# Patient Record
Sex: Female | Born: 1955 | Race: Black or African American | Hispanic: No | Marital: Married | Smoking: Never smoker
Health system: Southern US, Community
[De-identification: ages and names within clinical notes are randomized; demographics above are authoritative.]

## PROBLEM LIST (undated history)

## (undated) DIAGNOSIS — Z9889 Other specified postprocedural states: Secondary | ICD-10-CM

## (undated) DIAGNOSIS — I219 Acute myocardial infarction, unspecified: Secondary | ICD-10-CM

## (undated) DIAGNOSIS — I10 Essential (primary) hypertension: Secondary | ICD-10-CM

## (undated) DIAGNOSIS — J3089 Other allergic rhinitis: Secondary | ICD-10-CM

## (undated) DIAGNOSIS — E785 Hyperlipidemia, unspecified: Secondary | ICD-10-CM

## (undated) HISTORY — DX: Essential (primary) hypertension: I10

## (undated) HISTORY — DX: Hyperlipidemia, unspecified: E78.5

## (undated) HISTORY — DX: Acute myocardial infarction, unspecified: I21.9

## (undated) HISTORY — DX: Other specified postprocedural states: Z98.890

## (undated) HISTORY — DX: Other allergic rhinitis: J30.89

---

## 1994-05-03 HISTORY — PX: UTERINE FIBROID SURGERY: SHX826

## 2013-01-04 ENCOUNTER — Ambulatory Visit: Payer: Self-pay

## 2014-08-14 ENCOUNTER — Encounter: Payer: Self-pay | Admitting: Internal Medicine

## 2014-08-14 DIAGNOSIS — Z9889 Other specified postprocedural states: Secondary | ICD-10-CM

## 2014-08-14 DIAGNOSIS — J302 Other seasonal allergic rhinitis: Secondary | ICD-10-CM | POA: Insufficient documentation

## 2014-08-14 DIAGNOSIS — I10 Essential (primary) hypertension: Secondary | ICD-10-CM | POA: Insufficient documentation

## 2014-08-14 HISTORY — DX: Other specified postprocedural states: Z98.890

## 2014-10-16 ENCOUNTER — Encounter: Payer: Self-pay | Admitting: Internal Medicine

## 2016-06-03 DIAGNOSIS — I219 Acute myocardial infarction, unspecified: Secondary | ICD-10-CM

## 2016-06-03 HISTORY — DX: Acute myocardial infarction, unspecified: I21.9

## 2017-01-01 HISTORY — PX: CORONARY ANGIOPLASTY WITH STENT PLACEMENT: SHX49

## 2017-01-11 DIAGNOSIS — I5020 Unspecified systolic (congestive) heart failure: Secondary | ICD-10-CM | POA: Insufficient documentation

## 2017-01-11 DIAGNOSIS — I509 Heart failure, unspecified: Secondary | ICD-10-CM | POA: Insufficient documentation

## 2017-01-11 DIAGNOSIS — I5022 Chronic systolic (congestive) heart failure: Secondary | ICD-10-CM | POA: Insufficient documentation

## 2017-01-11 DIAGNOSIS — I252 Old myocardial infarction: Secondary | ICD-10-CM | POA: Insufficient documentation

## 2017-01-11 LAB — HEMOGLOBIN A1C: Hemoglobin A1C: 6

## 2018-02-10 DIAGNOSIS — M1711 Unilateral primary osteoarthritis, right knee: Secondary | ICD-10-CM | POA: Diagnosis not present

## 2018-05-01 ENCOUNTER — Other Ambulatory Visit: Payer: Self-pay | Admitting: Internal Medicine

## 2018-05-01 ENCOUNTER — Encounter: Payer: Self-pay | Admitting: Internal Medicine

## 2018-05-01 DIAGNOSIS — E782 Mixed hyperlipidemia: Secondary | ICD-10-CM | POA: Insufficient documentation

## 2018-05-01 DIAGNOSIS — I214 Non-ST elevation (NSTEMI) myocardial infarction: Secondary | ICD-10-CM

## 2018-05-01 DIAGNOSIS — E781 Pure hyperglyceridemia: Secondary | ICD-10-CM | POA: Insufficient documentation

## 2018-05-01 DIAGNOSIS — R7303 Prediabetes: Secondary | ICD-10-CM | POA: Insufficient documentation

## 2018-05-02 ENCOUNTER — Ambulatory Visit: Payer: BLUE CROSS/BLUE SHIELD | Admitting: Internal Medicine

## 2018-05-02 ENCOUNTER — Encounter: Payer: Self-pay | Admitting: Internal Medicine

## 2018-05-02 VITALS — BP 118/68 | HR 79 | Ht 62.0 in | Wt 186.0 lb

## 2018-05-02 DIAGNOSIS — E781 Pure hyperglyceridemia: Secondary | ICD-10-CM | POA: Diagnosis not present

## 2018-05-02 DIAGNOSIS — B070 Plantar wart: Secondary | ICD-10-CM

## 2018-05-02 DIAGNOSIS — R7303 Prediabetes: Secondary | ICD-10-CM | POA: Diagnosis not present

## 2018-05-02 DIAGNOSIS — Z23 Encounter for immunization: Secondary | ICD-10-CM | POA: Diagnosis not present

## 2018-05-02 DIAGNOSIS — I252 Old myocardial infarction: Secondary | ICD-10-CM | POA: Diagnosis not present

## 2018-05-02 DIAGNOSIS — I10 Essential (primary) hypertension: Secondary | ICD-10-CM | POA: Diagnosis not present

## 2018-05-02 MED ORDER — ATORVASTATIN CALCIUM 80 MG PO TABS: 80.0000 mg | ORAL_TABLET | Freq: Every day | ORAL | 0 refills | Status: DC

## 2018-05-02 MED ORDER — CLOPIDOGREL BISULFATE 75 MG PO TABS: 75.0000 mg | ORAL_TABLET | Freq: Every day | ORAL | 0 refills | Status: DC

## 2018-05-02 MED ORDER — AMLODIPINE BESYLATE 10 MG PO TABS: 10.0000 mg | ORAL_TABLET | Freq: Every day | ORAL | 0 refills | Status: DC

## 2018-05-02 MED ORDER — CARVEDILOL 25 MG PO TABS: 25.0000 mg | ORAL_TABLET | Freq: Two times a day (BID) | ORAL | 0 refills | Status: DC

## 2018-05-02 NOTE — Progress Notes (Signed)
Date:  05/02/2018   Name:  Mardelle Radzinski   DOB:  01-25-1956   MRN:  [id number]   Chief Complaint: Establish Care; Hyperlipidemia (Med refill); Hypertension (Med refill. ); and Foot Pain (Left foot. Spot popped up on side of left foot abotu a year ago. Wanted to get looked at .)  Diabetes  She presents for her follow-up diabetic visit. Diabetes type: pre-diabetes. Pertinent negatives for hypoglycemia include no dizziness or headaches. Pertinent negatives for diabetes include no chest pain and no fatigue. An ACE inhibitor/angiotensin II receptor blocker is not being taken.  Hypertension  This is a chronic problem. Pertinent negatives include no chest pain, headaches, palpitations or shortness of breath. Hypertensive end-organ damage includes CAD/MI. PTCA and Stent 2018.  Hyperlipidemia  This is a chronic problem. Pertinent negatives include no chest pain or shortness of breath. Current antihyperlipidemic treatment includes statins. The current treatment provides significant improvement of lipids.  CAD - s/p PTCA and stent in 2018.  She has not been seen in follow up for a number of months.  I encourage her to follow up with [facility] at least yearly. Wart - on side of left foot - she files it down and sometimes a 'core' is noted in the center.  It is not always painful but causes more pressure when it enlarges. She has not sought any specific treatment.   Review of Systems  Constitutional: Negative for chills, fatigue and fever.  Respiratory: Negative for cough, chest tightness, shortness of breath and wheezing.   Cardiovascular: Negative for chest pain, palpitations and leg swelling.  Gastrointestinal: Negative for abdominal pain.  Musculoskeletal: Negative for arthralgias and gait problem.  Skin:       Corn/callus/wart on left foot  Allergic/Immunologic: Negative for environmental allergies.  Neurological: Negative for dizziness and headaches.  Hematological: Negative  for adenopathy. Does not bruise/bleed easily.  Psychiatric/Behavioral: Negative for dysphoric mood and sleep disturbance.    Patient Active Problem List   Diagnosis Date Noted  . Hypertriglyceridemia 05/01/2018  . Pre-diabetes 05/01/2018  . Systolic heart failure (HCC) 01/11/2017  . History of non-ST elevation myocardial infarction (NSTEMI) 01/11/2017  . Essential (primary) hypertension 08/14/2014  . Allergic rhinitis, seasonal 08/14/2014    Allergies  Allergen Reactions  . Codeine Nausea And Vomiting    Past Surgical History:  Procedure Laterality Date  . CORONARY ANGIOPLASTY WITH STENT PLACEMENT  01/2017   DES to LAD  . UTERINE FIBROID SURGERY  1996    Social History   Tobacco Use  . Smoking status: Never Smoker  . Smokeless tobacco: Never Used  Substance Use Topics  . Alcohol use: Never    Alcohol/week: 0.0 standard drinks    Frequency: Never  . Drug use: Never     Medication list has been reviewed and updated.  Current Meds  Medication Sig  . amLODipine (NORVASC) 10 MG tablet Take 10 mg by mouth daily.  Marland Kitchen aspirin 81 MG chewable tablet Chew 81 mg by mouth daily.  Marland Kitchen atorvastatin (LIPITOR) 80 MG tablet Take 80 mg by mouth daily.  . carvedilol (COREG) 25 MG tablet Take 1 tablet by mouth 2 (two) times daily.  . Cetirizine HCl 10 MG CAPS Take 1 capsule by mouth daily.  . clopidogrel (PLAVIX) 75 MG tablet Take 75 mg by mouth daily.  . fluticasone (FLONASE) 50 MCG/ACT nasal spray Place 2 sprays into the nose daily.    PHQ 2/9 Scores 05/02/2018  PHQ - 2 Score 0  Physical Exam Vitals signs and nursing note reviewed.  Constitutional:      General: She is not in acute distress.    Appearance: She is well-developed.  HENT:     Head: Normocephalic and atraumatic.  Eyes:     Extraocular Movements: Extraocular movements intact.     Pupils: Pupils are equal, round, and reactive to light.  Neck:     Musculoskeletal: Normal range of motion and neck supple.    Cardiovascular:     Rate and Rhythm: Normal rate and regular rhythm.     Pulses: Normal pulses.  Pulmonary:     Effort: Pulmonary effort is normal. No respiratory distress.     Breath sounds: Normal breath sounds.  Musculoskeletal: Normal range of motion.  Lymphadenopathy:     Cervical: No cervical adenopathy.  Skin:    General: Skin is warm and dry.     Findings: No rash.       Neurological:     Mental Status: She is alert and oriented to person, place, and time.     Sensory: Sensation is intact.     Motor: Motor function is intact.     Coordination: Coordination is intact.     Gait: Gait normal.     Deep Tendon Reflexes:     Reflex Scores:      Patellar reflexes are 1+ on the right side and 1+ on the left side. Psychiatric:        Mood and Affect: Mood normal.        Behavior: Behavior normal.        Thought Content: Thought content normal.     BP 118/68 (BP Location: Right Arm, Patient Position: Sitting, Cuff Size: Large)   Pulse 79   Ht 5\' 2"  (1.575 m)   Wt 186 lb (84.4 kg)   SpO2 96%   BMI 34.02 kg/m   Assessment and Plan: 1. Essential (primary) hypertension Controlled, continue current therapy - amLODipine (NORVASC) 10 MG tablet; Take 1 tablet (10 mg total) by mouth daily.  Dispense: 90 tablet; Refill: 0 - carvedilol (COREG) 25 MG tablet; Take 1 tablet (25 mg total) by mouth 2 (two) times daily.  Dispense: 180 tablet; Refill: 0 - CBC with Differential/Platelet - TSH  2. Hypertriglyceridemia Continue statin therapy - atorvastatin (LIPITOR) 80 MG tablet; Take 1 tablet (80 mg total) by mouth daily.  Dispense: 90 tablet; Refill: 0 - Comprehensive metabolic panel - Lipid panel  3. Pre-diabetes Check labs - Hemoglobin A1c  4. History of non-ST elevation myocardial infarction (NSTEMI) Continue Plavix, see Cardiology annually for follow up - clopidogrel (PLAVIX) 75 MG tablet; Take 1 tablet (75 mg total) by mouth daily.  Dispense: 90 tablet; Refill: 0  5.  Need for immunization against influenza - Flu Vaccine QUAD 36+ mos IM  6. Plantar wart Local care, refer to Podiatry or Dermatology if more problematic   Partially dictated using Dragon software. Any errors are unintentional.  Bari EdwardLaura Ladislav Caselli, MD [facility]  05/02/2018

## 2018-05-03 LAB — COMPREHENSIVE METABOLIC PANEL
ALBUMIN: 4.5 g/dL (ref 3.6–4.8)
ALT: 22 IU/L (ref 0–32)
AST: 22 IU/L (ref 0–40)
Albumin/Globulin Ratio: 1.7 (ref 1.2–2.2)
Alkaline Phosphatase: 127 IU/L — ABNORMAL HIGH (ref 39–117)
BUN/Creatinine Ratio: 14 (ref 12–28)
BUN: 13 mg/dL (ref 8–27)
Bilirubin Total: 0.3 mg/dL (ref 0.0–1.2)
CO2: 25 mmol/L (ref 20–29)
Calcium: 9.4 mg/dL (ref 8.7–10.3)
Chloride: 102 mmol/L (ref 96–106)
Creatinine, Ser: 0.92 mg/dL (ref 0.57–1.00)
GFR calc Af Amer: 76 mL/min/{1.73_m2} (ref >59)
GFR calc non Af Amer: 66 mL/min/{1.73_m2} (ref >59)
GLUCOSE: 88 mg/dL (ref 65–99)
Globulin, Total: 2.7 g/dL (ref 1.5–4.5)
Potassium: 4.1 mmol/L (ref 3.5–5.2)
Sodium: 141 mmol/L (ref 134–144)
Total Protein: 7.2 g/dL (ref 6.0–8.5)

## 2018-05-03 LAB — HEMOGLOBIN A1C
Est. average glucose Bld gHb Est-mCnc: 128 mg/dL
Hgb A1c MFr Bld: 6.1 % — ABNORMAL HIGH (ref 4.8–5.6)

## 2018-05-03 LAB — CBC WITH DIFFERENTIAL/PLATELET
Basophils Absolute: 0.1 10*3/uL (ref 0.0–0.2)
Basos: 1 %
EOS (ABSOLUTE): 0.1 10*3/uL (ref 0.0–0.4)
Eos: 2 %
Hematocrit: 40.4 % (ref 34.0–46.6)
Hemoglobin: 13.6 g/dL (ref 11.1–15.9)
Immature Grans (Abs): 0 10*3/uL (ref 0.0–0.1)
Immature Granulocytes: 1 %
Lymphocytes Absolute: 2.1 10*3/uL (ref 0.7–3.1)
Lymphs: 32 %
MCH: 28.8 pg (ref 26.6–33.0)
MCHC: 33.7 g/dL (ref 31.5–35.7)
MCV: 86 fL (ref 79–97)
Monocytes Absolute: 0.5 10*3/uL (ref 0.1–0.9)
Monocytes: 8 %
Neutrophils Absolute: 3.7 10*3/uL (ref 1.4–7.0)
Neutrophils: 56 %
Platelets: 446 10*3/uL (ref 150–450)
RBC: 4.72 x10E6/uL (ref 3.77–5.28)
RDW: 14.4 % (ref 12.3–15.4)
WBC: 6.6 10*3/uL (ref 3.4–10.8)

## 2018-05-03 LAB — LIPID PANEL
CHOL/HDL RATIO: 5.1 ratio — AB (ref 0.0–4.4)
Cholesterol, Total: 203 mg/dL — ABNORMAL HIGH (ref 100–199)
HDL: 40 mg/dL (ref >39)
LDL Calculated: 112 mg/dL — ABNORMAL HIGH (ref 0–99)
Triglycerides: 255 mg/dL — ABNORMAL HIGH (ref 0–149)
VLDL Cholesterol Cal: 51 mg/dL — ABNORMAL HIGH (ref 5–40)

## 2018-05-03 LAB — TSH: TSH: 2.2 u[IU]/mL (ref 0.450–4.500)

## 2018-07-18 ENCOUNTER — Telehealth: Payer: Self-pay

## 2018-07-18 MED ORDER — CETIRIZINE HCL 10 MG PO CAPS: 1.0000 | ORAL_CAPSULE | Freq: Every day | ORAL | 3 refills | Status: DC

## 2018-07-18 NOTE — Telephone Encounter (Signed)
Called for Rx of Zyrtec since [facility] is sold out. [facility]

## 2018-08-15 ENCOUNTER — Encounter: Payer: Self-pay | Admitting: Internal Medicine

## 2018-08-15 ENCOUNTER — Other Ambulatory Visit: Payer: Self-pay

## 2018-08-15 ENCOUNTER — Ambulatory Visit (INDEPENDENT_AMBULATORY_CARE_PROVIDER_SITE_OTHER): Payer: BLUE CROSS/BLUE SHIELD | Admitting: Internal Medicine

## 2018-08-15 VITALS — Ht 62.0 in | Wt 186.0 lb

## 2018-08-15 DIAGNOSIS — B373 Candidiasis of vulva and vagina: Secondary | ICD-10-CM

## 2018-08-15 DIAGNOSIS — B3731 Acute candidiasis of vulva and vagina: Secondary | ICD-10-CM

## 2018-08-15 MED ORDER — FLUCONAZOLE 100 MG PO TABS: 100.0000 mg | ORAL_TABLET | ORAL | 0 refills | Status: AC

## 2018-08-15 NOTE — Progress Notes (Signed)
Date:  08/15/2018   Name:  Cassidy Dawson   DOB:  1956/01/14   MRN:  [id number]  I connected with this patient, Cassidy Dawson, by telephone at the patient's home.  I verified that I am speaking with the correct person using two identifiers. This visit was conducted via telephone due to the Covid-19 outbreak from my office at [facility] in [place], [place]. I discussed the limitations, risks, security and privacy concerns of performing an evaluation and management service by telephone. I also discussed with the patient that there may be a patient responsible charge related to this service. The patient expressed understanding and agreed to proceed.  Chief Complaint: Rash (Rash on side of vagina. Started almost a week ago. Spray underwear and vagina with body spray. Itches sometimes. Only hurts when scratching. )  Rash  This is a new problem. The current episode started in the past 7 days. The affected locations include the genitalia. The rash is characterized by burning and itchiness. She was exposed to a new detergent/soap. Pertinent negatives include no cough, diarrhea, fever, shortness of breath or vomiting. Past treatments include nothing.  She describes white areas on both inner labia.  They are tender when scratched because of the itching.  She has not seen any vaginal bleeding or discharge.  She has burning with urination when the urine contacts the areas involved.  She has no hx of herpes and no concerns about STIs. She has not tried any treatments.  Review of Systems  Constitutional: Negative for chills and fever.  Respiratory: Negative for cough and shortness of breath.   Cardiovascular: Negative for chest pain.  Gastrointestinal: Negative for diarrhea and vomiting.  Genitourinary: Positive for genital sores. Negative for difficulty urinating, dysuria, frequency, pelvic pain, vaginal bleeding, vaginal discharge and vaginal pain.  Skin: Positive for rash.    Patient  Active Problem List   Diagnosis Date Noted  . Plantar wart 05/02/2018  . Hypertriglyceridemia 05/01/2018  . Pre-diabetes 05/01/2018  . Systolic heart failure (HCC) 01/11/2017  . History of non-ST elevation myocardial infarction (NSTEMI) 01/11/2017  . Essential (primary) hypertension 08/14/2014  . Allergic rhinitis, seasonal 08/14/2014    Allergies  Allergen Reactions  . Codeine Nausea And Vomiting    Past Surgical History:  Procedure Laterality Date  . CORONARY ANGIOPLASTY WITH STENT PLACEMENT  01/2017   DES to LAD  . UTERINE FIBROID SURGERY  1996    Social History   Tobacco Use  . Smoking status: Never Smoker  . Smokeless tobacco: Never Used  Substance Use Topics  . Alcohol use: Never    Alcohol/week: 0.0 standard drinks    Frequency: Never  . Drug use: Never     Medication list has been reviewed and updated.  Current Meds  Medication Sig  . amLODipine (NORVASC) 10 MG tablet Take 1 tablet (10 mg total) by mouth daily.  Marland Kitchen aspirin 81 MG chewable tablet Chew 81 mg by mouth daily.  Marland Kitchen atorvastatin (LIPITOR) 80 MG tablet Take 1 tablet (80 mg total) by mouth daily.  . carvedilol (COREG) 25 MG tablet Take 1 tablet (25 mg total) by mouth 2 (two) times daily.  . Cetirizine HCl 10 MG CAPS Take 1 capsule (10 mg total) by mouth daily.  . clopidogrel (PLAVIX) 75 MG tablet Take 1 tablet (75 mg total) by mouth daily.  . fluticasone (FLONASE) 50 MCG/ACT nasal spray Place 2 sprays into the nose daily.    PHQ 2/9 Scores 08/15/2018 05/02/2018  PHQ - 2 Score 0 0    BP Readings from Last 3 Encounters:  05/02/18 118/68  08/02/14 140/80    Physical Exam Pulmonary:     Comments: Voice normal, no apparent dyspnea during the call. Neurological:     Mental Status: She is alert.  Psychiatric:        Attention and Perception: Attention normal.        Mood and Affect: Mood normal.        Speech: Speech normal.        Cognition and Memory: Cognition normal.     Wt Readings from  Last 3 Encounters:  08/15/18 186 lb (84.4 kg)  05/02/18 186 lb (84.4 kg)  08/02/14 165 lb (74.8 kg)    Ht 5\' 2"  (1.575 m)   Wt 186 lb (84.4 kg)   BMI 34.02 kg/m   Assessment and Plan: 1. Yeast vaginitis Also use otc diaper rash ointment to protect the sensitive areas If no improvement, will need to be seen - fluconazole (DIFLUCAN) 100 MG tablet; Take 1 tablet (100 mg total) by mouth every other day for 3 doses.  Dispense: 3 tablet; Refill: 0  I spent 11 minutes on this call.  Partially dictated using Animal nutritionist. Any errors are unintentional.  Bari Edward, MD [facility]  08/15/2018

## 2018-09-04 ENCOUNTER — Other Ambulatory Visit: Payer: Self-pay | Admitting: Internal Medicine

## 2018-09-04 DIAGNOSIS — I10 Essential (primary) hypertension: Secondary | ICD-10-CM

## 2018-09-26 ENCOUNTER — Other Ambulatory Visit: Payer: Self-pay

## 2018-09-26 ENCOUNTER — Telehealth: Payer: Self-pay

## 2018-09-26 DIAGNOSIS — I10 Essential (primary) hypertension: Secondary | ICD-10-CM

## 2018-09-26 NOTE — Telephone Encounter (Signed)
Patient called saying she needs RFs on all of her medications. Informed her that her medications ran out in March and asked her if she has been taking. She said she ran out a long time ago. Just wants RFs. Told her to start back up on her medications we need to see her for her appt on Monday. She said okay and said she iwll be here.

## 2018-09-28 ENCOUNTER — Encounter: Payer: BLUE CROSS/BLUE SHIELD | Admitting: Internal Medicine

## 2018-10-02 ENCOUNTER — Other Ambulatory Visit: Payer: Self-pay

## 2018-10-02 ENCOUNTER — Ambulatory Visit (INDEPENDENT_AMBULATORY_CARE_PROVIDER_SITE_OTHER): Payer: BLUE CROSS/BLUE SHIELD | Admitting: Internal Medicine

## 2018-10-02 ENCOUNTER — Other Ambulatory Visit (HOSPITAL_COMMUNITY)
Admission: RE | Admit: 2018-10-02 | Discharge: 2018-10-02 | Disposition: A | Discharge status: Home / Self Care | Payer: BC Managed Care – PPO | Source: Ambulatory Visit | Attending: Internal Medicine | Admitting: Internal Medicine

## 2018-10-02 ENCOUNTER — Encounter: Payer: Self-pay | Admitting: Internal Medicine

## 2018-10-02 VITALS — BP 136/80 | HR 75 | Ht 62.0 in | Wt 188.0 lb

## 2018-10-02 DIAGNOSIS — Z1211 Encounter for screening for malignant neoplasm of colon: Secondary | ICD-10-CM

## 2018-10-02 DIAGNOSIS — R7303 Prediabetes: Secondary | ICD-10-CM

## 2018-10-02 DIAGNOSIS — I10 Essential (primary) hypertension: Secondary | ICD-10-CM | POA: Diagnosis not present

## 2018-10-02 DIAGNOSIS — Z124 Encounter for screening for malignant neoplasm of cervix: Secondary | ICD-10-CM

## 2018-10-02 DIAGNOSIS — Z1231 Encounter for screening mammogram for malignant neoplasm of breast: Secondary | ICD-10-CM | POA: Diagnosis not present

## 2018-10-02 DIAGNOSIS — Z0000 Encounter for general adult medical examination without abnormal findings: Secondary | ICD-10-CM | POA: Diagnosis not present

## 2018-10-02 DIAGNOSIS — E781 Pure hyperglyceridemia: Secondary | ICD-10-CM

## 2018-10-02 DIAGNOSIS — Z1159 Encounter for screening for other viral diseases: Secondary | ICD-10-CM

## 2018-10-02 DIAGNOSIS — I5020 Unspecified systolic (congestive) heart failure: Secondary | ICD-10-CM | POA: Diagnosis not present

## 2018-10-02 DIAGNOSIS — I252 Old myocardial infarction: Secondary | ICD-10-CM

## 2018-10-02 LAB — POCT URINALYSIS DIPSTICK
Bilirubin, UA: NEGATIVE
Blood, UA: NEGATIVE
Glucose, UA: NEGATIVE
Ketones, UA: NEGATIVE
Leukocytes, UA: NEGATIVE
Nitrite, UA: NEGATIVE
Protein, UA: NEGATIVE
Spec Grav, UA: 1.02 (ref 1.010–1.025)
Urobilinogen, UA: 0.2 E.U./dL
pH, UA: 5 (ref 5.0–8.0)

## 2018-10-02 MED ORDER — CLOPIDOGREL BISULFATE 75 MG PO TABS: 75.0000 mg | ORAL_TABLET | Freq: Every day | ORAL | 3 refills | Status: DC

## 2018-10-02 MED ORDER — AMLODIPINE BESYLATE 10 MG PO TABS: 10.0000 mg | ORAL_TABLET | Freq: Every day | ORAL | 3 refills | Status: DC

## 2018-10-02 MED ORDER — ATORVASTATIN CALCIUM 80 MG PO TABS: 80.0000 mg | ORAL_TABLET | Freq: Every day | ORAL | 3 refills | Status: DC

## 2018-10-02 NOTE — Progress Notes (Signed)
Date:  10/02/2018   Name:  Cassidy Dawson   DOB:  23-Nov-1955   MRN:  [id number]   Chief Complaint: Annual Exam (Breast exam and papsmear.) Cassidy Dawson is a 63 y.o. female who presents today for her Complete Annual Exam. She feels well. She reports exercising walking. She reports she is sleeping well.  No mammogram on file No colonoscopy on file No Pap on file  Hypertension  This is a chronic problem. The problem is controlled. Pertinent negatives include no chest pain, headaches, palpitations or shortness of breath. Past treatments include beta blockers and calcium channel blockers. The current treatment provides significant improvement. Hypertensive end-organ damage includes CAD/MI.  Hyperlipidemia  The problem is controlled. Pertinent negatives include no chest pain or shortness of breath. Current antihyperlipidemic treatment includes statins. The current treatment provides significant improvement of lipids. There are no compliance problems.   Diabetes  She presents for her follow-up diabetic visit. Diabetes type: pre-diabaetes. Pertinent negatives for hypoglycemia include no dizziness, headaches, nervousness/anxiousness or tremors. Pertinent negatives for diabetes include no chest pain, no fatigue, no polydipsia and no polyuria.  Uterine fibroids - she has her last Pap about 20 years ago and was normal.  She underwent fibroidectomy in hopes of being able to conceive.  Lab Results  Component Value Date   HGBA1C 6.1 (H) 05/02/2018   Lab Results  Component Value Date   CREATININE 0.92 05/02/2018   BUN 13 05/02/2018   NA 141 05/02/2018   K 4.1 05/02/2018   CL 102 05/02/2018   CO2 25 05/02/2018   Lab Results  Component Value Date   CHOL 203 (H) 05/02/2018   HDL 40 05/02/2018   LDLCALC 112 (H) 05/02/2018   TRIG 255 (H) 05/02/2018   CHOLHDL 5.1 (H) 05/02/2018     Review of Systems  Constitutional: Negative for chills, fatigue and fever.  HENT: Negative  for congestion, hearing loss, tinnitus, trouble swallowing and voice change.   Eyes: Negative for visual disturbance.  Respiratory: Negative for cough, chest tightness, shortness of breath and wheezing.   Cardiovascular: Negative for chest pain, palpitations and leg swelling.  Gastrointestinal: Negative for abdominal pain, constipation, diarrhea and vomiting.  Endocrine: Negative for polydipsia and polyuria.  Genitourinary: Negative for dysuria, frequency, genital sores, pelvic pain, vaginal bleeding and vaginal discharge.  Musculoskeletal: Negative for arthralgias, gait problem and joint swelling.  Skin: Negative for color change and rash.  Allergic/Immunologic: Positive for environmental allergies.  Neurological: Negative for dizziness, tremors, light-headedness and headaches.  Hematological: Negative for adenopathy. Does not bruise/bleed easily.  Psychiatric/Behavioral: Negative for dysphoric mood and sleep disturbance. The patient is not nervous/anxious.     Patient Active Problem List   Diagnosis Date Noted  . Plantar wart 05/02/2018  . Hypertriglyceridemia 05/01/2018  . Pre-diabetes 05/01/2018  . Systolic heart failure (HCC) 01/11/2017  . History of non-ST elevation myocardial infarction (NSTEMI) 01/11/2017  . Essential (primary) hypertension 08/14/2014  . Allergic rhinitis, seasonal 08/14/2014    Allergies  Allergen Reactions  . Codeine Nausea And Vomiting    Past Surgical History:  Procedure Laterality Date  . CORONARY ANGIOPLASTY WITH STENT PLACEMENT  01/2017   DES to LAD  . UTERINE FIBROID SURGERY  1996    Social History   Tobacco Use  . Smoking status: Never Smoker  . Smokeless tobacco: Never Used  Substance Use Topics  . Alcohol use: Never    Alcohol/week: 0.0 standard drinks    Frequency: Never  . Drug use:  Never     Medication list has been reviewed and updated.  Current Meds  Medication Sig  . amLODipine (NORVASC) 10 MG tablet Take 1 tablet (10 mg  total) by mouth daily.  Marland Kitchen aspirin 81 MG chewable tablet Chew 81 mg by mouth daily.  Marland Kitchen atorvastatin (LIPITOR) 80 MG tablet Take 1 tablet (80 mg total) by mouth daily.  . carvedilol (COREG) 25 MG tablet TAKE 1 TABLET(25 MG) BY MOUTH TWICE DAILY  . Cetirizine HCl 10 MG CAPS Take 1 capsule (10 mg total) by mouth daily.  . clopidogrel (PLAVIX) 75 MG tablet Take 1 tablet (75 mg total) by mouth daily.  . fluticasone (FLONASE) 50 MCG/ACT nasal spray Place 2 sprays into the nose daily.    PHQ 2/9 Scores 10/02/2018 08/15/2018 05/02/2018  PHQ - 2 Score 0 0 0    BP Readings from Last 3 Encounters:  10/02/18 136/80  05/02/18 118/68  08/02/14 140/80    Physical Exam Vitals signs and nursing note reviewed.  Constitutional:      General: She is not in acute distress.    Appearance: She is well-developed.  HENT:     Head: Normocephalic and atraumatic.     Right Ear: Tympanic membrane and ear canal normal.     Left Ear: Tympanic membrane and ear canal normal.     Nose:     Right Sinus: No maxillary sinus tenderness.     Left Sinus: No maxillary sinus tenderness.     Mouth/Throat:     Pharynx: Uvula midline.  Eyes:     General: No scleral icterus.       Right eye: No discharge.        Left eye: No discharge.     Conjunctiva/sclera: Conjunctivae normal.  Neck:     Musculoskeletal: Normal range of motion. No erythema.     Thyroid: No thyromegaly.     Vascular: No carotid bruit.  Cardiovascular:     Rate and Rhythm: Normal rate and regular rhythm.     Pulses: Normal pulses.     Heart sounds: Normal heart sounds.  Pulmonary:     Effort: Pulmonary effort is normal. No respiratory distress.     Breath sounds: No wheezing.  Chest:     Breasts:        Right: No mass, nipple discharge, skin change or tenderness.        Left: No mass, nipple discharge, skin change or tenderness.  Abdominal:     General: Bowel sounds are normal.     Palpations: Abdomen is soft.     Tenderness: There is no  abdominal tenderness.  Genitourinary:    Labia:        Right: No rash, tenderness or lesion.        Left: No rash, tenderness or lesion.      Vagina: Normal.     Cervix: Normal.     Uterus: Normal. Enlarged (right sided fullness ~4 cm c/w fibroid). Not tender.      Adnexa: Right adnexa normal and left adnexa normal.  Musculoskeletal: Normal range of motion.  Lymphadenopathy:     Cervical: No cervical adenopathy.  Skin:    General: Skin is warm and dry.     Findings: No rash.  Neurological:     Mental Status: She is alert and oriented to person, place, and time.     Cranial Nerves: No cranial nerve deficit.     Sensory: No sensory deficit.     Deep  Tendon Reflexes: Reflexes are normal and symmetric.  Psychiatric:        Speech: Speech normal.        Behavior: Behavior normal.        Thought Content: Thought content normal.     Wt Readings from Last 3 Encounters:  10/02/18 188 lb (85.3 kg)  08/15/18 186 lb (84.4 kg)  05/02/18 186 lb (84.4 kg)    BP 136/80   Pulse 75   Ht 5\' 2"  (1.575 m)   Wt 188 lb (85.3 kg)   SpO2 96%   BMI 34.39 kg/m   Assessment and Plan: 1. Annual physical exam Normal exam except for weight - discussed diet and exercise - POCT urinalysis dipstick  2. Encounter for screening mammogram for breast cancer Schedule at [facility] - MM 3D SCREEN BREAST BILATERAL; Future  3. Essential (primary) hypertension controlled - CBC with Differential/Platelet - Comprehensive metabolic panel - TSH - amLODipine (NORVASC) 10 MG tablet; Take 1 tablet (10 mg total) by mouth daily.  Dispense: 90 tablet; Refill: 3  4. Hypertriglyceridemia Continue medications - Lipid panel - atorvastatin (LIPITOR) 80 MG tablet; Take 1 tablet (80 mg total) by mouth daily.  Dispense: 90 tablet; Refill: 3  5. Pre-diabetes Check labs; discussed low carb diet - Hemoglobin A1c  6. Systolic heart failure, unspecified HF chronicity (HCC) Appears compensated - recommend Cardiology  follow up in the near future  7. Colon cancer screening - Fecal occult blood, imunochemical  8. Need for hepatitis C screening test - Hepatitis C antibody  9. History of non-ST elevation myocardial infarction (NSTEMI) Doing well - clopidogrel (PLAVIX) 75 MG tablet; Take 1 tablet (75 mg total) by mouth daily.  Dispense: 90 tablet; Refill: 3  10. Encounter for Papanicolaou smear for cervical cancer screening - Cytology - PAP   Partially dictated using Dragon software. Any errors are unintentional.  Bari Edward, MD [facility]  10/02/2018

## 2018-10-02 NOTE — Patient Instructions (Signed)

## 2018-10-03 LAB — TSH: TSH: 2.76 u[IU]/mL (ref 0.450–4.500)

## 2018-10-03 LAB — CBC WITH DIFFERENTIAL/PLATELET
Basophils Absolute: 0.1 10*3/uL (ref 0.0–0.2)
Basos: 1 %
EOS (ABSOLUTE): 0.1 10*3/uL (ref 0.0–0.4)
Eos: 1 %
Hematocrit: 41 % (ref 34.0–46.6)
Hemoglobin: 13.8 g/dL (ref 11.1–15.9)
Immature Grans (Abs): 0 10*3/uL (ref 0.0–0.1)
Immature Granulocytes: 0 %
Lymphocytes Absolute: 2.3 10*3/uL (ref 0.7–3.1)
Lymphs: 29 %
MCH: 29.7 pg (ref 26.6–33.0)
MCHC: 33.7 g/dL (ref 31.5–35.7)
MCV: 88 fL (ref 79–97)
Monocytes Absolute: 0.6 10*3/uL (ref 0.1–0.9)
Monocytes: 8 %
Neutrophils Absolute: 4.9 10*3/uL (ref 1.4–7.0)
Neutrophils: 61 %
Platelets: 412 10*3/uL (ref 150–450)
RBC: 4.64 x10E6/uL (ref 3.77–5.28)
RDW: 14 % (ref 11.7–15.4)
WBC: 8 10*3/uL (ref 3.4–10.8)

## 2018-10-03 LAB — COMPREHENSIVE METABOLIC PANEL
ALT: 17 IU/L (ref 0–32)
AST: 24 IU/L (ref 0–40)
Albumin/Globulin Ratio: 1.6 (ref 1.2–2.2)
Albumin: 4.6 g/dL (ref 3.8–4.8)
Alkaline Phosphatase: 118 IU/L — ABNORMAL HIGH (ref 39–117)
BUN/Creatinine Ratio: 22 (ref 12–28)
BUN: 18 mg/dL (ref 8–27)
Bilirubin Total: 0.5 mg/dL (ref 0.0–1.2)
CO2: 22 mmol/L (ref 20–29)
Calcium: 9.9 mg/dL (ref 8.7–10.3)
Chloride: 100 mmol/L (ref 96–106)
Creatinine, Ser: 0.83 mg/dL (ref 0.57–1.00)
GFR calc Af Amer: 86 mL/min/{1.73_m2} (ref >59)
GFR calc non Af Amer: 75 mL/min/{1.73_m2} (ref >59)
Globulin, Total: 2.9 g/dL (ref 1.5–4.5)
Glucose: 93 mg/dL (ref 65–99)
Potassium: 4.6 mmol/L (ref 3.5–5.2)
Sodium: 142 mmol/L (ref 134–144)
Total Protein: 7.5 g/dL (ref 6.0–8.5)

## 2018-10-03 LAB — LIPID PANEL
Chol/HDL Ratio: 5.3 ratio — ABNORMAL HIGH (ref 0.0–4.4)
Cholesterol, Total: 186 mg/dL (ref 100–199)
HDL: 35 mg/dL — ABNORMAL LOW (ref >39)
LDL Calculated: 113 mg/dL — ABNORMAL HIGH (ref 0–99)
Triglycerides: 189 mg/dL — ABNORMAL HIGH (ref 0–149)
VLDL Cholesterol Cal: 38 mg/dL (ref 5–40)

## 2018-10-03 LAB — HEPATITIS C ANTIBODY: Hep C Virus Ab: 0.1 s/co ratio (ref 0.0–0.9)

## 2018-10-03 LAB — HEMOGLOBIN A1C
Est. average glucose Bld gHb Est-mCnc: 126 mg/dL
Hgb A1c MFr Bld: 6 % — ABNORMAL HIGH (ref 4.8–5.6)

## 2018-10-05 LAB — CYTOLOGY - PAP: Diagnosis: NEGATIVE

## 2018-10-18 ENCOUNTER — Encounter (INDEPENDENT_AMBULATORY_CARE_PROVIDER_SITE_OTHER): Payer: Self-pay

## 2018-10-18 ENCOUNTER — Other Ambulatory Visit: Payer: Self-pay

## 2018-10-18 ENCOUNTER — Ambulatory Visit
Admission: RE | Admit: 2018-10-18 | Discharge: 2018-10-18 | Disposition: A | Discharge status: Home / Self Care | Payer: BC Managed Care – PPO | Source: Ambulatory Visit | Attending: Internal Medicine | Admitting: Internal Medicine

## 2018-10-18 DIAGNOSIS — Z1231 Encounter for screening mammogram for malignant neoplasm of breast: Secondary | ICD-10-CM | POA: Insufficient documentation

## 2018-10-19 ENCOUNTER — Other Ambulatory Visit: Payer: Self-pay | Admitting: Internal Medicine

## 2018-10-19 DIAGNOSIS — R928 Other abnormal and inconclusive findings on diagnostic imaging of breast: Secondary | ICD-10-CM

## 2018-10-19 DIAGNOSIS — N6310 Unspecified lump in the right breast, unspecified quadrant: Secondary | ICD-10-CM

## 2019-01-22 ENCOUNTER — Encounter: Payer: Self-pay | Admitting: Internal Medicine

## 2019-01-22 ENCOUNTER — Other Ambulatory Visit: Payer: Self-pay

## 2019-01-22 ENCOUNTER — Ambulatory Visit (INDEPENDENT_AMBULATORY_CARE_PROVIDER_SITE_OTHER): Payer: Medicare Other | Admitting: Internal Medicine

## 2019-01-22 VITALS — BP 120/78 | HR 60 | Ht 62.0 in | Wt 187.0 lb

## 2019-01-22 DIAGNOSIS — M533 Sacrococcygeal disorders, not elsewhere classified: Secondary | ICD-10-CM | POA: Diagnosis not present

## 2019-01-22 DIAGNOSIS — Z23 Encounter for immunization: Secondary | ICD-10-CM | POA: Diagnosis not present

## 2019-01-22 MED ORDER — CYCLOBENZAPRINE HCL 10 MG PO TABS: 10.0000 mg | ORAL_TABLET | Freq: Every day | ORAL | 0 refills | Status: DC

## 2019-01-22 NOTE — Progress Notes (Signed)
Date:  01/22/2019   Name:  Cassidy Dawson   DOB:  1956/04/23   MRN:  [id number]   Chief Complaint: Hip Pain (Left hip- no falls. Hurts within left buttcheek. Stabbing pain if moving in a certain way. Sitting for too long can cause her to have troble standing.) and Immunizations (reg dose flu shot.)  Hip Pain  The incident occurred 5 to 7 days ago. There was no injury mechanism. The pain is present in the left hip. The quality of the pain is described as burning and stabbing. The pain is mild. Pertinent negatives include no muscle weakness or numbness. She reports no foreign bodies present. The symptoms are aggravated by movement and palpation.    Review of Systems  Constitutional: Negative for chills, fatigue and fever.  Respiratory: Negative for chest tightness and shortness of breath.   Cardiovascular: Negative for chest pain.  Musculoskeletal: Positive for arthralgias. Negative for back pain, gait problem and joint swelling.  Neurological: Negative for weakness and numbness.    Patient Active Problem List   Diagnosis Date Noted  . Plantar wart 05/02/2018  . Hypertriglyceridemia 05/01/2018  . Pre-diabetes 05/01/2018  . Systolic heart failure ([place]) 01/11/2017  . History of non-ST elevation myocardial infarction (NSTEMI) 01/11/2017  . Essential (primary) hypertension 08/14/2014  . Allergic rhinitis, seasonal 08/14/2014    Allergies  Allergen Reactions  . Codeine Nausea And Vomiting    Past Surgical History:  Procedure Laterality Date  . CORONARY ANGIOPLASTY WITH STENT PLACEMENT  01/2017   DES to LAD  . UTERINE FIBROID SURGERY  1996    Social History   Tobacco Use  . Smoking status: Never Smoker  . Smokeless tobacco: Never Used  Substance Use Topics  . Alcohol use: Never    Alcohol/week: 0.0 standard drinks    Frequency: Never  . Drug use: Never     Medication list has been reviewed and updated.  Current Meds  Medication Sig  . amLODipine  (NORVASC) 10 MG tablet Take 1 tablet (10 mg total) by mouth daily.  Marland Kitchen aspirin 81 MG chewable tablet Chew 81 mg by mouth daily.  Marland Kitchen atorvastatin (LIPITOR) 80 MG tablet Take 1 tablet (80 mg total) by mouth daily.  . carvedilol (COREG) 25 MG tablet TAKE 1 TABLET(25 MG) BY MOUTH TWICE DAILY  . Cetirizine HCl 10 MG CAPS Take 1 capsule (10 mg total) by mouth daily.  . clopidogrel (PLAVIX) 75 MG tablet Take 1 tablet (75 mg total) by mouth daily.  . fluticasone (FLONASE) 50 MCG/ACT nasal spray Place 2 sprays into the nose daily.  . nitroGLYCERIN (NITROSTAT) 0.4 MG SL tablet Place 0.4 mg under the tongue every 5 (five) minutes as needed for chest pain.    PHQ 2/9 Scores 01/22/2019 10/02/2018 08/15/2018 05/02/2018  PHQ - 2 Score 0 0 0 0    BP Readings from Last 3 Encounters:  01/22/19 120/78  10/02/18 136/80  05/02/18 118/68    Physical Exam Vitals signs and nursing note reviewed.  Constitutional:      General: She is not in acute distress.    Appearance: She is well-developed.  HENT:     Head: Normocephalic and atraumatic.  Cardiovascular:     Rate and Rhythm: Normal rate and regular rhythm.  Pulmonary:     Effort: Pulmonary effort is normal. No respiratory distress.     Breath sounds: Normal breath sounds.  Abdominal:     General: Abdomen is flat. Bowel sounds are normal.  Tenderness: There is no abdominal tenderness.  Musculoskeletal: Normal range of motion.     Right hip: Normal.     Left hip: Normal.     Lumbar back: She exhibits bony tenderness (over right SI region).     Comments: SLR negative bilaterally  Skin:    General: Skin is warm and dry.     Findings: No rash.  Neurological:     Mental Status: She is alert and oriented to person, place, and time.  Psychiatric:        Behavior: Behavior normal.        Thought Content: Thought content normal.     Wt Readings from Last 3 Encounters:  01/22/19 187 lb (84.8 kg)  10/02/18 188 lb (85.3 kg)  08/15/18 186 lb (84.4 kg)     BP 120/78   Pulse 60   Ht 5\' 2"  (1.575 m)   Wt 187 lb (84.8 kg)   SpO2 97%   BMI 34.20 kg/m   Assessment and Plan: 1. Sacro-iliac pain Take tylenol three times per day Use ice or heat Flexeril at bedtime Follow up as needed - cyclobenzaprine (FLEXERIL) 10 MG tablet; Take 1 tablet (10 mg total) by mouth at bedtime.  Dispense: 30 tablet; Refill: 0  2. Need for immunization against influenza - Flu Vaccine QUAD 36+ mos IM   Partially dictated using Animal nutritionist. Any errors are unintentional.  Bari Edward, MD [facility]  01/22/2019

## 2019-01-22 NOTE — Patient Instructions (Addendum)
Take Tylenol three times a day.  Use Flexeril only at bedtime.  Use Ice or Heat as needed

## 2019-02-03 ENCOUNTER — Other Ambulatory Visit: Payer: Self-pay | Admitting: Internal Medicine

## 2019-02-03 DIAGNOSIS — I10 Essential (primary) hypertension: Secondary | ICD-10-CM

## 2019-04-04 ENCOUNTER — Ambulatory Visit: Payer: BLUE CROSS/BLUE SHIELD | Admitting: Internal Medicine

## 2019-04-10 ENCOUNTER — Ambulatory Visit: Payer: Medicare Other | Admitting: Internal Medicine

## 2019-07-02 ENCOUNTER — Ambulatory Visit: Payer: Medicare Other | Admitting: Internal Medicine

## 2019-08-29 LAB — HEMOGLOBIN A1C: Hemoglobin A1C: 6.3

## 2019-08-30 ENCOUNTER — Telehealth: Payer: Self-pay | Admitting: Internal Medicine

## 2019-08-30 ENCOUNTER — Other Ambulatory Visit: Payer: Self-pay

## 2019-08-30 NOTE — Telephone Encounter (Signed)
Reviewed with Dr Judithann Graves and abstracted pts A1C.  CM

## 2019-08-30 NOTE — Telephone Encounter (Signed)
Copied from CRM [id number]. Topic: General - Inquiry >> Aug 29, 2019  4:51 PM Floria Raveling A wrote: Reason for CRM:  NP with house call called in and wanted Dr Judithann Graves to know what pts number were  A1c 6.3 Weight was 187 Bmi 34.2

## 2019-10-04 ENCOUNTER — Other Ambulatory Visit: Payer: Self-pay | Admitting: Internal Medicine

## 2019-10-04 ENCOUNTER — Ambulatory Visit (INDEPENDENT_AMBULATORY_CARE_PROVIDER_SITE_OTHER): Payer: Medicare Other | Admitting: Internal Medicine

## 2019-10-04 ENCOUNTER — Encounter: Payer: Self-pay | Admitting: Internal Medicine

## 2019-10-04 ENCOUNTER — Other Ambulatory Visit: Payer: Self-pay

## 2019-10-04 VITALS — BP 142/80 | HR 62 | Temp 98.2°F | Ht 62.0 in | Wt 188.0 lb

## 2019-10-04 DIAGNOSIS — Z0000 Encounter for general adult medical examination without abnormal findings: Secondary | ICD-10-CM | POA: Diagnosis not present

## 2019-10-04 DIAGNOSIS — I252 Old myocardial infarction: Secondary | ICD-10-CM

## 2019-10-04 DIAGNOSIS — Z1231 Encounter for screening mammogram for malignant neoplasm of breast: Secondary | ICD-10-CM

## 2019-10-04 DIAGNOSIS — Z1211 Encounter for screening for malignant neoplasm of colon: Secondary | ICD-10-CM

## 2019-10-04 DIAGNOSIS — R7303 Prediabetes: Secondary | ICD-10-CM | POA: Diagnosis not present

## 2019-10-04 DIAGNOSIS — H9190 Unspecified hearing loss, unspecified ear: Secondary | ICD-10-CM

## 2019-10-04 DIAGNOSIS — Z23 Encounter for immunization: Secondary | ICD-10-CM

## 2019-10-04 DIAGNOSIS — I10 Essential (primary) hypertension: Secondary | ICD-10-CM

## 2019-10-04 DIAGNOSIS — Z114 Encounter for screening for human immunodeficiency virus [HIV]: Secondary | ICD-10-CM

## 2019-10-04 DIAGNOSIS — I5020 Unspecified systolic (congestive) heart failure: Secondary | ICD-10-CM

## 2019-10-04 DIAGNOSIS — Z13820 Encounter for screening for osteoporosis: Secondary | ICD-10-CM

## 2019-10-04 DIAGNOSIS — E781 Pure hyperglyceridemia: Secondary | ICD-10-CM

## 2019-10-04 LAB — POCT URINALYSIS DIPSTICK
Bilirubin, UA: NEGATIVE
Blood, UA: NEGATIVE
Glucose, UA: NEGATIVE
Ketones, UA: NEGATIVE
Leukocytes, UA: NEGATIVE
Nitrite, UA: NEGATIVE
Protein, UA: NEGATIVE
Spec Grav, UA: 1.015 (ref 1.010–1.025)
Urobilinogen, UA: 0.2 E.U./dL
pH, UA: 5 (ref 5.0–8.0)

## 2019-10-04 MED ORDER — CLOPIDOGREL BISULFATE 75 MG PO TABS: 75.0000 mg | ORAL_TABLET | Freq: Every day | ORAL | 3 refills | Status: DC

## 2019-10-04 MED ORDER — ATORVASTATIN CALCIUM 80 MG PO TABS: 80.0000 mg | ORAL_TABLET | Freq: Every day | ORAL | 3 refills | Status: DC

## 2019-10-04 MED ORDER — FLUTICASONE PROPIONATE 50 MCG/ACT NA SUSP
2.0000 | Freq: Every day | NASAL | 0 refills | Status: DC
Start: 2019-10-04 — End: 2019-10-04

## 2019-10-04 MED ORDER — AMLODIPINE BESYLATE 10 MG PO TABS: 10.0000 mg | ORAL_TABLET | Freq: Every day | ORAL | 3 refills | Status: DC

## 2019-10-04 NOTE — Progress Notes (Signed)
Date:  10/04/2019   Name:  Cassidy Dawson   DOB:  1956-03-07   MRN:  [id number]   Chief Complaint: Annual Exam (breast exam/ no pap/ HIV screening/pneu shot) Cassidy Dawson is a 64 y.o. female who presents today for her Complete Annual Exam. She feels well. She reports exercising walking occasionally. She reports she is sleeping well. She denies breast issues.  Mammogram  10/2018 DEXA none Colonoscopy - none FIT given last year but not done Immunization History  Administered Date(s) Administered  . Influenza,inj,Quad PF,6+ Mos 06/28/2016, 05/02/2018, 01/22/2019  . Janssen (J&J) SARS-COV-2 Vaccination 07/14/2019    Hypertension This is a chronic problem. The problem is controlled. Pertinent negatives include no chest pain, headaches, palpitations or shortness of breath (with exertion). Past treatments include beta blockers and calcium channel blockers. The current treatment provides significant improvement. Hypertensive end-organ damage includes CAD/MI (nstemi in 2018 managed medically). There is no history of kidney disease or CVA.  Hyperlipidemia The problem is controlled. Pertinent negatives include no chest pain or shortness of breath (with exertion). Current antihyperlipidemic treatment includes statins. The current treatment provides significant improvement of lipids.  Diabetes She presents for her follow-up diabetic visit. Diabetes type: prediabetes. Pertinent negatives for hypoglycemia include no dizziness, headaches, nervousness/anxiousness or tremors. Pertinent negatives for diabetes include no chest pain, no fatigue, no polydipsia and no polyuria. Pertinent negatives for diabetic complications include no CVA. Current diabetic treatment includes diet. She is compliant with treatment most of the time.  CAD s/p nstemi in 2018 - seen at [facility], treated medically after RHC.  Review of records shows no cardiology follow up has been done.  She has some DOE but it is  stable. Known 2-vessel disease after she presented to [facility] on in February 2018 with hypertensive emergency and NSTEMI. Her troponin peaked at 14.4, TTE showed LVH w/ LVEF of 35-40%, G2DD, and mildly dilated RV with reduced systolic function. Her LHC showed diffuse disease of the LCx and RCA. No PCI was performed at that admission and medical management was recommended w/ ASA, Plavix, Coreg, Lipitor.    Lab Results  Component Value Date   CREATININE 0.83 10/02/2018   BUN 18 10/02/2018   NA 142 10/02/2018   K 4.6 10/02/2018   CL 100 10/02/2018   CO2 22 10/02/2018   Lab Results  Component Value Date   CHOL 186 10/02/2018   HDL 35 (L) 10/02/2018   LDLCALC 113 (H) 10/02/2018   TRIG 189 (H) 10/02/2018   CHOLHDL 5.3 (H) 10/02/2018   Lab Results  Component Value Date   TSH 2.760 10/02/2018   Lab Results  Component Value Date   HGBA1C 6.3 08/29/2019   Lab Results  Component Value Date   WBC 8.0 10/02/2018   HGB 13.8 10/02/2018   HCT 41.0 10/02/2018   MCV 88 10/02/2018   PLT 412 10/02/2018   Lab Results  Component Value Date   ALT 17 10/02/2018   AST 24 10/02/2018   ALKPHOS 118 (H) 10/02/2018   BILITOT 0.5 10/02/2018     Review of Systems  Constitutional: Negative for chills, fatigue and fever.  HENT: Positive for hearing loss. Negative for congestion, tinnitus, trouble swallowing and voice change.   Eyes: Negative for visual disturbance.  Respiratory: Negative for cough, chest tightness, shortness of breath (with exertion) and wheezing.   Cardiovascular: Negative for chest pain, palpitations and leg swelling.  Gastrointestinal: Negative for abdominal pain, constipation, diarrhea and vomiting.  Endocrine: Negative for polydipsia  and polyuria.  Genitourinary: Negative for dysuria, frequency, genital sores, vaginal bleeding and vaginal discharge.  Musculoskeletal: Negative for arthralgias, gait problem and joint swelling.  Skin: Negative for color change and rash.   Allergic/Immunologic: Positive for environmental allergies.  Neurological: Negative for dizziness, tremors, light-headedness and headaches.  Hematological: Negative for adenopathy. Does not bruise/bleed easily.  Psychiatric/Behavioral: Negative for dysphoric mood and sleep disturbance. The patient is not nervous/anxious.     Patient Active Problem List   Diagnosis Date Noted  . Plantar wart 05/02/2018  . Hypertriglyceridemia 05/01/2018  . Pre-diabetes 05/01/2018  . Systolic heart failure ([facility]) 01/11/2017  . History of non-ST elevation myocardial infarction (NSTEMI) 01/11/2017  . Essential (primary) hypertension 08/14/2014  . Allergic rhinitis, seasonal 08/14/2014    Allergies  Allergen Reactions  . Codeine Nausea And Vomiting    Past Surgical History:  Procedure Laterality Date  . CORONARY ANGIOPLASTY WITH STENT PLACEMENT  01/2017   DES to LAD  . UTERINE FIBROID SURGERY  1996    Social History   Tobacco Use  . Smoking status: Never Smoker  . Smokeless tobacco: Never Used  Substance Use Topics  . Alcohol use: Never    Alcohol/week: 0.0 standard drinks  . Drug use: Never     Medication list has been reviewed and updated.  Current Meds  Medication Sig  . amLODipine (NORVASC) 10 MG tablet Take 1 tablet (10 mg total) by mouth daily.  Marland Kitchen aspirin 81 MG chewable tablet Chew 81 mg by mouth daily.  Marland Kitchen atorvastatin (LIPITOR) 80 MG tablet Take 1 tablet (80 mg total) by mouth daily.  . carvedilol (COREG) 25 MG tablet TAKE 1 TABLET(25 MG) BY MOUTH TWICE DAILY  . Cetirizine HCl 10 MG CAPS Take 1 capsule (10 mg total) by mouth daily. (Patient taking differently: Take 1 capsule by mouth daily. 1/2 tablet 5 mg)  . clopidogrel (PLAVIX) 75 MG tablet Take 1 tablet (75 mg total) by mouth daily.  . cyclobenzaprine (FLEXERIL) 10 MG tablet Take 1 tablet (10 mg total) by mouth at bedtime. (Patient taking differently: Take 10 mg by mouth at bedtime. As needed)  . fluticasone (FLONASE) 50  MCG/ACT nasal spray Place 2 sprays into the nose daily.  . nitroGLYCERIN (NITROSTAT) 0.4 MG SL tablet Place 0.4 mg under the tongue every 5 (five) minutes as needed for chest pain.  Marland Kitchen VITAMIN D, CHOLECALCIFEROL, PO Take 1 tablet by mouth.    PHQ 2/9 Scores 10/04/2019 01/22/2019 10/02/2018 08/15/2018  PHQ - 2 Score 0 0 0 0  PHQ- 9 Score 0 - - -   GAD 7 : Generalized Anxiety Score 10/04/2019  Nervous, Anxious, on Edge 0  Control/stop worrying 0  Worry too much - different things 0  Trouble relaxing 0  Restless 0  Easily annoyed or irritable 0  Afraid - awful might happen 0  Total GAD 7 Score 0  Anxiety Difficulty Not difficult at all    BP Readings from Last 3 Encounters:  10/04/19 (!) 142/80  01/22/19 120/78  10/02/18 136/80    Physical Exam Vitals and nursing note reviewed.  Constitutional:      General: She is not in acute distress.    Appearance: She is well-developed.  HENT:     Head: Normocephalic and atraumatic.     Right Ear: Tympanic membrane and ear canal normal.     Left Ear: Tympanic membrane and ear canal normal.     Nose:     Right Sinus: No maxillary sinus tenderness.  Left Sinus: No maxillary sinus tenderness.  Eyes:     General: No scleral icterus.       Right eye: No discharge.        Left eye: No discharge.     Conjunctiva/sclera: Conjunctivae normal.  Neck:     Thyroid: No thyromegaly.     Vascular: No carotid bruit.  Cardiovascular:     Rate and Rhythm: Normal rate and regular rhythm.     Pulses: Normal pulses.     Heart sounds: Normal heart sounds.  Pulmonary:     Effort: Pulmonary effort is normal. No respiratory distress.     Breath sounds: No wheezing.  Chest:     Breasts:        Right: No mass, nipple discharge, skin change or tenderness.        Left: No mass, nipple discharge, skin change or tenderness.  Abdominal:     General: Bowel sounds are normal.     Palpations: Abdomen is soft.     Tenderness: There is no abdominal tenderness.  There is no guarding or rebound.  Musculoskeletal:        General: Normal range of motion.     Cervical back: Normal range of motion. No erythema.     Right lower leg: No edema.     Left lower leg: No edema.  Lymphadenopathy:     Cervical: No cervical adenopathy.  Skin:    General: Skin is warm and dry.     Capillary Refill: Capillary refill takes less than 2 seconds.     Findings: No rash.  Neurological:     General: No focal deficit present.     Mental Status: She is alert and oriented to person, place, and time.     Cranial Nerves: No cranial nerve deficit.     Sensory: No sensory deficit.     Deep Tendon Reflexes: Reflexes are normal and symmetric.  Psychiatric:        Mood and Affect: Mood normal.        Speech: Speech normal.        Behavior: Behavior normal.     Wt Readings from Last 3 Encounters:  10/04/19 188 lb (85.3 kg)  01/22/19 187 lb (84.8 kg)  10/02/18 188 lb (85.3 kg)    BP (!) 142/80   Pulse 62   Temp 98.2 F (36.8 C) (Oral)   Ht 5\' 2"  (1.575 m)   Wt 188 lb (85.3 kg)   SpO2 96%   BMI 34.39 kg/m   Assessment and Plan: 1. Annual physical exam Normal exam except for weight Needs to work on diet and exercise as able - POCT urinalysis dipstick  2. Encounter for screening mammogram for breast cancer To be scheduled at [facility] - MM 3D SCREEN BREAST BILATERAL; Future  3. Colon cancer screening - Fecal occult blood, imunochemical  4. Essential (primary) hypertension Clinically stable exam with well controlled BP on current regimen. Tolerating medications without side effects at this time. Pt to continue current regimen and low sodium diet; benefits of regular exercise as able discussed. - CBC with Differential/Platelet - Comprehensive metabolic panel - TSH - amLODipine (NORVASC) 10 MG tablet; Take 1 tablet (10 mg total) by mouth daily.  Dispense: 90 tablet; Refill: 3  5. Hypertriglyceridemia Tolerating statin medication without side effects at this  time LDL is not at goal of < 70 on current dose.   Continue same therapy but may need to add medication if not improved -  Lipid panel - atorvastatin (LIPITOR) 80 MG tablet; Take 1 tablet (80 mg total) by mouth daily.  Dispense: 90 tablet; Refill: 3  6. Pre-diabetes Re-inforced low carb diet and weight control - Hemoglobin A1c  7. History of non-ST elevation myocardial infarction (NSTEMI) No cardiology follow up since her RHC in 2018 Having some stable DOE without chest pain. Continue Aspirin and Plavix and will refer back to [facility] - Ambulatory referral to Cardiology - clopidogrel (PLAVIX) 75 MG tablet; Take 1 tablet (75 mg total) by mouth daily.  Dispense: 90 tablet; Refill: 3  8. Need for vaccination for pneumococcus - Pneumococcal conjugate vaccine 13-valent IM  9. Encounter for screening for HIV - HIV Antibody (routine testing w rflx)  10. Systolic heart failure, unspecified HF chronicity [facility]) Per cardiology records On coreg  11. Encounter for screening for osteoporosis - DG Bone Density; Future  12. Decreased hearing, unspecified laterality - Ambulatory referral to ENT   Partially dictated using Dragon software. Any errors are unintentional.  Bari Edward, MD [facility]  10/04/2019

## 2019-10-04 NOTE — Addendum Note (Signed)
Addended by: Reubin Milan on: 10/04/2019 12:55 PM   Modules accepted: Orders

## 2019-10-05 LAB — COMPREHENSIVE METABOLIC PANEL
ALT: 16 IU/L (ref 0–32)
AST: 13 IU/L (ref 0–40)
Albumin/Globulin Ratio: 1.4 (ref 1.2–2.2)
Albumin: 4.2 g/dL (ref 3.8–4.8)
Alkaline Phosphatase: 141 IU/L — ABNORMAL HIGH (ref 48–121)
BUN/Creatinine Ratio: 13 (ref 12–28)
BUN: 10 mg/dL (ref 8–27)
Bilirubin Total: 0.4 mg/dL (ref 0.0–1.2)
CO2: 24 mmol/L (ref 20–29)
Calcium: 9.6 mg/dL (ref 8.7–10.3)
Chloride: 104 mmol/L (ref 96–106)
Creatinine, Ser: 0.8 mg/dL (ref 0.57–1.00)
GFR calc Af Amer: 89 mL/min/{1.73_m2} (ref >59)
GFR calc non Af Amer: 78 mL/min/{1.73_m2} (ref >59)
Globulin, Total: 3 g/dL (ref 1.5–4.5)
Glucose: 108 mg/dL — ABNORMAL HIGH (ref 65–99)
Potassium: 4.7 mmol/L (ref 3.5–5.2)
Sodium: 140 mmol/L (ref 134–144)
Total Protein: 7.2 g/dL (ref 6.0–8.5)

## 2019-10-05 LAB — CBC WITH DIFFERENTIAL/PLATELET
Basophils Absolute: 0.1 10*3/uL (ref 0.0–0.2)
Basos: 1 %
EOS (ABSOLUTE): 0.2 10*3/uL (ref 0.0–0.4)
Eos: 2 %
Hematocrit: 41.2 % (ref 34.0–46.6)
Hemoglobin: 13.2 g/dL (ref 11.1–15.9)
Immature Grans (Abs): 0 10*3/uL (ref 0.0–0.1)
Immature Granulocytes: 0 %
Lymphocytes Absolute: 1.9 10*3/uL (ref 0.7–3.1)
Lymphs: 28 %
MCH: 28.6 pg (ref 26.6–33.0)
MCHC: 32 g/dL (ref 31.5–35.7)
MCV: 89 fL (ref 79–97)
Monocytes Absolute: 0.4 10*3/uL (ref 0.1–0.9)
Monocytes: 7 %
Neutrophils Absolute: 4.2 10*3/uL (ref 1.4–7.0)
Neutrophils: 62 %
Platelets: 424 10*3/uL (ref 150–450)
RBC: 4.61 x10E6/uL (ref 3.77–5.28)
RDW: 13.4 % (ref 11.7–15.4)
WBC: 6.8 10*3/uL (ref 3.4–10.8)

## 2019-10-05 LAB — LIPID PANEL
Chol/HDL Ratio: 4.6 ratio — ABNORMAL HIGH (ref 0.0–4.4)
Cholesterol, Total: 148 mg/dL (ref 100–199)
HDL: 32 mg/dL — ABNORMAL LOW (ref >39)
LDL Chol Calc (NIH): 88 mg/dL (ref 0–99)
Triglycerides: 162 mg/dL — ABNORMAL HIGH (ref 0–149)
VLDL Cholesterol Cal: 28 mg/dL (ref 5–40)

## 2019-10-05 LAB — HIV ANTIBODY (ROUTINE TESTING W REFLEX): HIV Screen 4th Generation wRfx: NONREACTIVE

## 2019-10-05 LAB — HEMOGLOBIN A1C
Est. average glucose Bld gHb Est-mCnc: 126 mg/dL
Hgb A1c MFr Bld: 6 % — ABNORMAL HIGH (ref 4.8–5.6)

## 2019-10-05 LAB — TSH: TSH: 2.08 u[IU]/mL (ref 0.450–4.500)

## 2019-10-07 ENCOUNTER — Other Ambulatory Visit: Payer: Self-pay | Admitting: Internal Medicine

## 2019-10-07 DIAGNOSIS — E781 Pure hyperglyceridemia: Secondary | ICD-10-CM

## 2019-10-17 DIAGNOSIS — H903 Sensorineural hearing loss, bilateral: Secondary | ICD-10-CM | POA: Diagnosis not present

## 2019-10-22 ENCOUNTER — Other Ambulatory Visit: Payer: Self-pay

## 2019-11-15 ENCOUNTER — Other Ambulatory Visit: Payer: Self-pay

## 2019-12-11 ENCOUNTER — Other Ambulatory Visit: Payer: Self-pay

## 2019-12-31 ENCOUNTER — Other Ambulatory Visit: Payer: Self-pay

## 2019-12-31 DIAGNOSIS — Z1231 Encounter for screening mammogram for malignant neoplasm of breast: Secondary | ICD-10-CM

## 2020-02-06 ENCOUNTER — Telehealth: Payer: Self-pay | Admitting: Internal Medicine

## 2020-02-06 NOTE — Telephone Encounter (Signed)
Pt has been sch for awv on 02/27/2020 virtually

## 2020-02-06 NOTE — Telephone Encounter (Signed)
Left message for patient to call back and schedule Medicare Annual Wellness Visit (AWV) either virtually/audio only or in office. Whichever the patients preference is.  No history of AWV; please schedule at anytime with [facility] Health Advisor.  This should be a 40 minute visit  AWV-I AS OF 01/02/20 PER PAL[place]

## 2020-02-27 ENCOUNTER — Telehealth: Payer: Medicare Other

## 2020-03-03 ENCOUNTER — Other Ambulatory Visit: Payer: Self-pay | Admitting: Internal Medicine

## 2020-03-03 DIAGNOSIS — I10 Essential (primary) hypertension: Secondary | ICD-10-CM

## 2020-03-03 NOTE — Telephone Encounter (Signed)
Requested Prescriptions  Pending Prescriptions Disp Refills  . carvedilol (COREG) 25 MG tablet [Pharmacy Med Name: CARVEDILOL 25MG  TABLETS] 60 tablet 0    Sig: TAKE 1 TABLET(25 MG) BY MOUTH TWICE DAILY     Cardiovascular:  Beta Blockers Failed - 03/03/2020  6:20 AM      Failed - Last BP in normal range    BP Readings from Last 1 Encounters:  10/04/19 (!) 142/80         Passed - Last Heart Rate in normal range    Pulse Readings from Last 1 Encounters:  10/04/19 62         Passed - Valid encounter within last 6 months    Recent Outpatient Visits          5 months ago Annual physical exam   [facility], MD   1 year ago Sacro-iliac pain   [facility], [facility]   1 year ago Annual physical exam   [facility], MD   1 year ago Yeast vaginitis   [facility], MD   1 year ago Essential (primary) hypertension   [facility] Reubin Milan, MD      Future Appointments            In 7 months Reubin Milan Judithann Graves, MD [facility]            Pt. Is due for 6 mo. F/u in December; 30 day courtesy refill was given at this time.

## 2020-03-05 ENCOUNTER — Ambulatory Visit: Payer: Medicare Other

## 2020-03-24 ENCOUNTER — Other Ambulatory Visit: Payer: Self-pay | Admitting: Internal Medicine

## 2020-03-24 DIAGNOSIS — I10 Essential (primary) hypertension: Secondary | ICD-10-CM

## 2020-03-24 MED ORDER — CARVEDILOL 25 MG PO TABS: ORAL_TABLET | ORAL | 0 refills | Status: DC

## 2020-03-24 NOTE — Telephone Encounter (Signed)
Pt request refill  carvedilol (COREG) 25 MG tablet  Pt has made her 6 mo follow up as directions on last Rx indicated. However pt is going out of town and needs to get her refill before she goes   . [id number] - [place], [place] - [address] AT [facility] [address] [facility] [address] Phone:  [phone]  Fax:  [phone]

## 2020-03-24 NOTE — Telephone Encounter (Signed)
Patient has appt scheduled on 04/16/20

## 2020-03-25 ENCOUNTER — Telehealth: Payer: Self-pay | Admitting: Internal Medicine

## 2020-03-25 NOTE — Telephone Encounter (Signed)
Patient called to ask the nurse to call her to clarify her medication for  nitroGLYCERIN (NITROSTAT) 0.4 MG SL tablet.  Patient would like to know if her script is up to date and whether she can get it filled if need when she is out of town.  Please advise and call patient to discuss at [phone]

## 2020-04-01 ENCOUNTER — Other Ambulatory Visit: Payer: Self-pay | Admitting: Internal Medicine

## 2020-04-01 MED ORDER — NITROGLYCERIN 0.4 MG SL SUBL: 0.4000 mg | SUBLINGUAL_TABLET | SUBLINGUAL | 1 refills | Status: DC | PRN

## 2020-04-01 NOTE — Telephone Encounter (Signed)
Requested medication (s) are due for refill today: yes  Requested medication (s) are on the active medication list: yes  Last refill:  last filled by historical provider  Future visit scheduled: yes  Notes to clinic:  Please review for refill. Last filled by historical provider.    Requested Prescriptions  Pending Prescriptions Disp Refills   nitroGLYCERIN (NITROSTAT) 0.4 MG SL tablet      Sig: Place 1 tablet (0.4 mg total) under the tongue every 5 (five) minutes as needed for chest pain.      Cardiovascular:  Nitrates Failed - 04/01/2020  1:19 PM      Failed - Last BP in normal range    BP Readings from Last 1 Encounters:  10/04/19 (!) 142/80          Passed - Last Heart Rate in normal range    Pulse Readings from Last 1 Encounters:  10/04/19 62          Passed - Valid encounter within last 12 months    Recent Outpatient Visits           6 months ago Annual physical exam   [facility] Reubin Milan, MD   1 year ago Sacro-iliac pain   [facility] Reubin Milan, MD   1 year ago Annual physical exam   [facility] Reubin Milan, MD   1 year ago Yeast vaginitis   [facility] Reubin Milan, MD   1 year ago Essential (primary) hypertension   [facility] Reubin Milan, MD       Future Appointments             In 2 weeks Judithann Graves Nyoka Cowden, MD [facility]   In 6 months Judithann Graves, Nyoka Cowden, MD [facility]

## 2020-04-01 NOTE — Telephone Encounter (Signed)
Medication Refill - Medication: nitroGLYCERIN (NITROSTAT) 0.4 MG SL tablet    Has the patient contacted their pharmacy? yes (Agent: If no, request that the patient contact the pharmacy for the refill.) (Agent: If yes, when and what did the pharmacy advise?)Contact PCP  Preferred Pharmacy (with phone number or street name):  [facility] #[id number] - [place], [place] - [address] AT [facility] OF [address] [facility] [address] Phone:  [phone]  Fax:  [phone]       Agent: Please be advised that RX refills may take up to 3 business days. We ask that you follow-up with your pharmacy.

## 2020-04-07 ENCOUNTER — Telehealth: Payer: Medicare Other | Admitting: Internal Medicine

## 2020-04-08 ENCOUNTER — Encounter: Payer: Self-pay | Admitting: Internal Medicine

## 2020-04-08 ENCOUNTER — Ambulatory Visit (INDEPENDENT_AMBULATORY_CARE_PROVIDER_SITE_OTHER): Payer: Medicare Other | Admitting: Internal Medicine

## 2020-04-08 ENCOUNTER — Telehealth: Payer: Self-pay

## 2020-04-08 VITALS — Temp 98.5°F | Ht 62.0 in | Wt 188.0 lb

## 2020-04-08 DIAGNOSIS — Z20822 Contact with and (suspected) exposure to covid-19: Secondary | ICD-10-CM

## 2020-04-08 MED ORDER — HYDROCODONE-HOMATROPINE 5-1.5 MG/5ML PO SYRP: 5.0000 mL | ORAL_SOLUTION | Freq: Four times a day (QID) | ORAL | 0 refills | Status: AC | PRN

## 2020-04-08 MED ORDER — AZITHROMYCIN 250 MG PO TABS: ORAL_TABLET | ORAL | 0 refills | Status: AC

## 2020-04-08 NOTE — Telephone Encounter (Signed)
This visit type is being conducted due to national recommendations for restrictions regarding the COVID- 19 Pandemic (e.g. social distancing) in effort to limit this patients exposure and mitigate transmission in our community. This visit type is felt to be most appropriate for this patient at this time.   I connected with the patient today and received telephone consent from the patient and patient understand this consent will be good for 1 year. 

## 2020-04-08 NOTE — Progress Notes (Signed)
Date:  04/08/2020   Name:  Cassidy Dawson   DOB:  [birth year]   MRN:  [id number]  This encounter was conducted via video encounter due to the need for social distancing in light of the Covid-19 pandemic.  The patient was correctly identified. She is located at her home. I advised that I am conducting the visit from a secure room in my office at [facility] clinic.   The limitations of this form of encounter were discussed with the patient and he/she agreed to proceed.  Some vital signs will be absent.  Chief Complaint: Cough (Started Sunday - Cough, Fever on Sunday. But no fever since. Cough is keeping her up at night. Taking Mucinex DM and not helping. Tempeture was 102. And had another low grade fever yesterday 100.8. Has not been tested for Covid. )  Cough This is a new problem. Episode onset: 2 days ago. The problem has been unchanged. The problem occurs hourly. The cough is non-productive. Associated symptoms include a fever (up to 102 for one day) and wheezing. Pertinent negatives include no chest pain, chills, ear pain, headaches, rhinorrhea or sore throat. She has tried OTC cough suppressant (tylenol, advil,mucinex) for the symptoms.   Immunization History  Administered Date(s) Administered  . Influenza,inj,Quad PF,6+ Mos 06/28/2016, 05/02/2018, 01/22/2019  . Janssen (J&J) SARS-COV-2 Vaccination 07/14/2019  . PFIZER SARS-COV-2 Vaccination 02/01/2020  . Pneumococcal Conjugate-13 10/04/2019    Lab Results  Component Value Date   CREATININE 0.80 10/04/2019   BUN 10 10/04/2019   NA 140 10/04/2019   K 4.7 10/04/2019   CL 104 10/04/2019   CO2 24 10/04/2019   Lab Results  Component Value Date   CHOL 148 10/04/2019   HDL 32 (L) 10/04/2019   LDLCALC 88 10/04/2019   TRIG 162 (H) 10/04/2019   CHOLHDL 4.6 (H) 10/04/2019   Lab Results  Component Value Date   TSH 2.080 10/04/2019   Lab Results  Component Value Date   HGBA1C 6.0 (H) 10/04/2019   Lab Results    Component Value Date   WBC 6.8 10/04/2019   HGB 13.2 10/04/2019   HCT 41.2 10/04/2019   MCV 89 10/04/2019   PLT 424 10/04/2019   Lab Results  Component Value Date   ALT 16 10/04/2019   AST 13 10/04/2019   ALKPHOS 141 (H) 10/04/2019   BILITOT 0.4 10/04/2019     Review of Systems  Constitutional: Positive for fever (up to 102 for one day). Negative for chills and fatigue.  HENT: Negative for ear pain, rhinorrhea and sore throat.   Respiratory: Positive for cough, chest tightness and wheezing.   Cardiovascular: Negative for chest pain and palpitations.  Gastrointestinal: Negative for diarrhea and nausea.  Neurological: Negative for dizziness, light-headedness and headaches.    Patient Active Problem List   Diagnosis Date Noted  . Plantar wart 05/02/2018  . Hypertriglyceridemia 05/01/2018  . Pre-diabetes 05/01/2018  . Systolic heart failure (HCC) 01/11/2017  . History of non-ST elevation myocardial infarction (NSTEMI) 01/11/2017  . Essential (primary) hypertension 08/14/2014  . Allergic rhinitis, seasonal 08/14/2014    Allergies  Allergen Reactions  . Codeine Nausea And Vomiting    Past Surgical History:  Procedure Laterality Date  . CORONARY ANGIOPLASTY WITH STENT PLACEMENT  01/2017   DES to LAD  . UTERINE FIBROID SURGERY  1996    Social History   Tobacco Use  . Smoking status: Never Smoker  . Smokeless tobacco: Never Used  Vaping Use  .  Vaping Use: Never used  Substance Use Topics  . Alcohol use: Never    Alcohol/week: 0.0 standard drinks  . Drug use: Never     Medication list has been reviewed and updated.  Current Meds  Medication Sig  . amLODipine (NORVASC) 10 MG tablet Take 1 tablet (10 mg total) by mouth daily.  Marland Kitchen aspirin 81 MG chewable tablet Chew 81 mg by mouth daily.  Marland Kitchen atorvastatin (LIPITOR) 80 MG tablet Take 1 tablet (80 mg total) by mouth daily.  . carvedilol (COREG) 25 MG tablet TAKE 1 TABLET(25 MG) BY MOUTH TWICE DAILY  . Cetirizine  HCl 10 MG CAPS Take 1 capsule (10 mg total) by mouth daily. (Patient taking differently: Take 1 capsule by mouth daily. 1/2 tablet 5 mg)  . cyclobenzaprine (FLEXERIL) 10 MG tablet Take 1 tablet (10 mg total) by mouth at bedtime.  . fluticasone (FLONASE) 50 MCG/ACT nasal spray SHAKE LIQUID AND USE 2 SPRAYS IN EACH NOSTRIL DAILY  . nitroGLYCERIN (NITROSTAT) 0.4 MG SL tablet Place 1 tablet (0.4 mg total) under the tongue every 5 (five) minutes as needed for chest pain.  Marland Kitchen VITAMIN D, CHOLECALCIFEROL, PO Take 1 tablet by mouth.    PHQ 2/9 Scores 04/08/2020 10/04/2019 01/22/2019 10/02/2018  PHQ - 2 Score 0 0 0 0  PHQ- 9 Score 0 0 - -    GAD 7 : Generalized Anxiety Score 04/08/2020 10/04/2019  Nervous, Anxious, on Edge 0 0  Control/stop worrying 0 0  Worry too much - different things 0 0  Trouble relaxing 0 0  Restless 0 0  Easily annoyed or irritable 0 0  Afraid - awful might happen 0 0  Total GAD 7 Score 0 0  Anxiety Difficulty Not difficult at all Not difficult at all    BP Readings from Last 3 Encounters:  10/04/19 (!) 142/80  01/22/19 120/78  10/02/18 136/80    Physical Exam Constitutional:      Appearance: Normal appearance.  HENT:     Mouth/Throat:     Comments: Voice slightly hoarse Pulmonary:     Effort: Pulmonary effort is normal.     Comments: Occasional loose cough but no dyspnea noted during the call Neurological:     Mental Status: She is alert.  Psychiatric:        Attention and Perception: Attention normal.        Mood and Affect: Mood normal.        Speech: Speech normal.     Wt Readings from Last 3 Encounters:  04/08/20 188 lb (85.3 kg)  10/04/19 188 lb (85.3 kg)  01/22/19 187 lb (84.8 kg)    Temp 98.5 F (36.9 C) (Oral)   Ht 5\' 2"  (1.575 m)   Wt 188 lb (85.3 kg)   BMI 34.39 kg/m   Assessment and Plan: 1. Suspected COVID-19 virus infection Recommend testing for Covid-19 - pt will contact [facility] to get this done. She is fully vaccinated. Begin zpak  and will give cough syrup to suppress cough for sleep If Covid +, would be a candidate for Mab infusion. - azithromycin (ZITHROMAX Z-PAK) 250 MG tablet; UAD  Dispense: 6 each; Refill: 0 - HYDROcodone-homatropine (HYCODAN) 5-1.5 MG/5ML syrup; Take 5 mLs by mouth every 6 (six) hours as needed for up to 10 days for cough.  Dispense: 120 mL; Refill: 0  I spent 15 minutes on this encounter. Partially dictated using . Any errors are unintentional.  Animal nutritionist, MD [facility]  04/08/2020

## 2020-04-09 ENCOUNTER — Telehealth: Payer: Self-pay

## 2020-04-09 NOTE — Telephone Encounter (Signed)
Copied from CRM [id number]. Topic: General - Other >> Apr 09, 2020 10:54 AM Gwenlyn Fudge wrote: Reason for CRM: Pt calling stating that she could not get a covid test until tomorrow and wanted to let PCP know. Please advise.

## 2020-04-09 NOTE — Telephone Encounter (Signed)
Noted, and this is okay. We will just have to wait for patients results when they return.

## 2020-04-10 ENCOUNTER — Telehealth: Payer: Self-pay

## 2020-04-10 NOTE — Telephone Encounter (Signed)
Copied from CRM [id number]. Topic: General - Other >> Apr 10, 2020  3:52 PM Cassidy Dawson wrote: Reason for CRM: Patient called in to inform Dr Judithann Graves that her and her husband both tested negative for Covid today. Ph# [phone]

## 2020-04-11 ENCOUNTER — Telehealth: Payer: Self-pay

## 2020-04-11 NOTE — Telephone Encounter (Signed)
Copied from CRM [id number]. 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 Pt had pneumonia symptoms   Best contact: [phone]

## 2020-04-11 NOTE — Telephone Encounter (Signed)
What was the result of her Covid test?  What days does she need covered by the letters?

## 2020-04-11 NOTE — Telephone Encounter (Signed)
Noted  KP 

## 2020-04-16 ENCOUNTER — Other Ambulatory Visit: Payer: Self-pay

## 2020-04-16 ENCOUNTER — Ambulatory Visit (INDEPENDENT_AMBULATORY_CARE_PROVIDER_SITE_OTHER): Payer: Medicare Other | Admitting: Internal Medicine

## 2020-04-16 ENCOUNTER — Encounter: Payer: Self-pay | Admitting: Internal Medicine

## 2020-04-16 VITALS — BP 124/68 | HR 72 | Temp 98.0°F | Ht 62.0 in | Wt 185.0 lb

## 2020-04-16 DIAGNOSIS — I10 Essential (primary) hypertension: Secondary | ICD-10-CM | POA: Diagnosis not present

## 2020-04-16 DIAGNOSIS — R7303 Prediabetes: Secondary | ICD-10-CM

## 2020-04-16 NOTE — Telephone Encounter (Signed)
Patient is scheduled for appt today

## 2020-04-16 NOTE — Progress Notes (Signed)
Date:  04/16/2020   Name:  Cassidy Dawson   DOB:  [birth year]   MRN:  [id number]   Chief Complaint: Hypertension and Prediabetes  Hypertension This is a chronic problem. The problem is controlled. Pertinent negatives include no chest pain, headaches or shortness of breath. Past treatments include beta blockers and calcium channel blockers. The current treatment provides significant improvement. There are no compliance problems.  There is no history of kidney disease or CAD/MI.  Diabetes She presents for her follow-up diabetic visit. Diabetes type: prediabetes. Pertinent negatives for hypoglycemia include no dizziness or headaches. Pertinent negatives for diabetes include no chest pain and no fatigue. Symptoms are stable. Current diabetic treatment includes diet. She is compliant with treatment most of the time.  Cough This is a new problem. The current episode started 1 to 4 weeks ago. The problem has been rapidly improving (she is ready to return to work). The cough is non-productive. Pertinent negatives include no chest pain, chills, headaches or shortness of breath.    Lab Results  Component Value Date   CREATININE 0.80 10/04/2019   BUN 10 10/04/2019   NA 140 10/04/2019   K 4.7 10/04/2019   CL 104 10/04/2019   CO2 24 10/04/2019   Lab Results  Component Value Date   CHOL 148 10/04/2019   HDL 32 (L) 10/04/2019   LDLCALC 88 10/04/2019   TRIG 162 (H) 10/04/2019   CHOLHDL 4.6 (H) 10/04/2019   Lab Results  Component Value Date   TSH 2.080 10/04/2019   Lab Results  Component Value Date   HGBA1C 6.0 (H) 10/04/2019   Lab Results  Component Value Date   WBC 6.8 10/04/2019   HGB 13.2 10/04/2019   HCT 41.2 10/04/2019   MCV 89 10/04/2019   PLT 424 10/04/2019   Lab Results  Component Value Date   ALT 16 10/04/2019   AST 13 10/04/2019   ALKPHOS 141 (H) 10/04/2019   BILITOT 0.4 10/04/2019     Review of Systems  Constitutional: Negative for chills and fatigue.   Respiratory: Positive for cough. Negative for chest tightness and shortness of breath.   Cardiovascular: Negative for chest pain and leg swelling.  Neurological: Negative for dizziness, light-headedness and headaches.    Patient Active Problem List   Diagnosis Date Noted  . Plantar wart 05/02/2018  . Hypertriglyceridemia 05/01/2018  . Pre-diabetes 05/01/2018  . Systolic heart failure (HCC) 01/11/2017  . History of non-ST elevation myocardial infarction (NSTEMI) 01/11/2017  . Essential (primary) hypertension 08/14/2014  . Allergic rhinitis, seasonal 08/14/2014    Allergies  Allergen Reactions  . Codeine Nausea And Vomiting    Past Surgical History:  Procedure Laterality Date  . CORONARY ANGIOPLASTY WITH STENT PLACEMENT  01/2017   DES to LAD  . UTERINE FIBROID SURGERY  1996    Social History   Tobacco Use  . Smoking status: Never Smoker  . Smokeless tobacco: Never Used  Vaping Use  . Vaping Use: Never used  Substance Use Topics  . Alcohol use: Never    Alcohol/week: 0.0 standard drinks  . Drug use: Never     Medication list has been reviewed and updated.  Current Meds  Medication Sig  . amLODipine (NORVASC) 10 MG tablet Take 1 tablet (10 mg total) by mouth daily.  . Ascorbic Acid (VITAMIN C PO) Take by mouth daily.  Marland Kitchen aspirin 81 MG chewable tablet Chew 81 mg by mouth daily.  Marland Kitchen atorvastatin (LIPITOR) 80 MG tablet Take 1  tablet (80 mg total) by mouth daily.  . carvedilol (COREG) 25 MG tablet TAKE 1 TABLET(25 MG) BY MOUTH TWICE DAILY  . Cetirizine HCl 10 MG CAPS Take 1 capsule (10 mg total) by mouth daily. (Patient taking differently: Take 1 capsule by mouth daily. 1/2 tablet 5 mg)  . Cyanocobalamin (VITAMIN B-12 PO) Take by mouth.  . fluticasone (FLONASE) 50 MCG/ACT nasal spray SHAKE LIQUID AND USE 2 SPRAYS IN EACH NOSTRIL DAILY  . HYDROcodone-homatropine (HYCODAN) 5-1.5 MG/5ML syrup Take 5 mLs by mouth every 6 (six) hours as needed for up to 10 days for cough.  .  nitroGLYCERIN (NITROSTAT) 0.4 MG SL tablet Place 1 tablet (0.4 mg total) under the tongue every 5 (five) minutes as needed for chest pain.  Marland Kitchen VITAMIN D, CHOLECALCIFEROL, PO Take 1 tablet by mouth.    PHQ 2/9 Scores 04/16/2020 04/08/2020 10/04/2019 01/22/2019  PHQ - 2 Score 0 0 0 0  PHQ- 9 Score 0 0 0 -    GAD 7 : Generalized Anxiety Score 04/16/2020 04/08/2020 10/04/2019  Nervous, Anxious, on Edge 0 0 0  Control/stop worrying 0 0 0  Worry too much - different things 0 0 0  Trouble relaxing 0 0 0  Restless 0 0 0  Easily annoyed or irritable 0 0 0  Afraid - awful might happen 0 0 0  Total GAD 7 Score 0 0 0  Anxiety Difficulty - Not difficult at all Not difficult at all    BP Readings from Last 3 Encounters:  04/16/20 124/68  10/04/19 (!) 142/80  01/22/19 120/78    Physical Exam Vitals and nursing note reviewed.  Constitutional:      General: She is not in acute distress.    Appearance: Normal appearance. She is well-developed.  HENT:     Head: Normocephalic and atraumatic.  Cardiovascular:     Rate and Rhythm: Normal rate and regular rhythm.  Pulmonary:     Effort: Pulmonary effort is normal. No respiratory distress.     Breath sounds: Normal breath sounds. No wheezing or rhonchi.  Musculoskeletal:        General: Normal range of motion.     Cervical back: Normal range of motion.     Right lower leg: No edema.     Left lower leg: No edema.  Lymphadenopathy:     Cervical: No cervical adenopathy.  Skin:    General: Skin is warm and dry.     Capillary Refill: Capillary refill takes less than 2 seconds.     Findings: No rash.  Neurological:     General: No focal deficit present.     Mental Status: She is alert and oriented to person, place, and time.  Psychiatric:        Mood and Affect: Mood and affect and mood normal.     Wt Readings from Last 3 Encounters:  04/16/20 185 lb (83.9 kg)  04/08/20 188 lb (85.3 kg)  10/04/19 188 lb (85.3 kg)    BP 124/68   Pulse 72    Temp 98 F (36.7 C) (Oral)   Ht 5\' 2"  (1.575 m)   Wt 185 lb (83.9 kg)   SpO2 95%   BMI 33.84 kg/m   Assessment and Plan: 1. Essential (primary) hypertension Clinically stable exam with well controlled BP on amlodipine and coreg. Tolerating medications without side effects at this time. Pt to continue current regimen and low sodium diet; benefits of regular exercise as able discussed.  2. Pre-diabetes Continue  working on low carb diet and weight loss Last A1C was only 6 and she has lost a few pounds so will not recheck today  Note is written to excuse her from work 04/08/20 to 04/15/20 and to return to work today.  Partially dictated using Animal nutritionist. Any errors are unintentional.  Bari Edward, MD [facility]  04/16/2020

## 2020-05-12 ENCOUNTER — Ambulatory Visit (INDEPENDENT_AMBULATORY_CARE_PROVIDER_SITE_OTHER): Payer: Medicare Other | Admitting: Internal Medicine

## 2020-05-12 ENCOUNTER — Encounter: Payer: Self-pay | Admitting: Internal Medicine

## 2020-05-12 ENCOUNTER — Other Ambulatory Visit: Payer: Self-pay

## 2020-05-12 VITALS — BP 124/80 | HR 62 | Temp 98.4°F | Ht 62.0 in | Wt 183.0 lb

## 2020-05-12 DIAGNOSIS — J0191 Acute recurrent sinusitis, unspecified: Secondary | ICD-10-CM

## 2020-05-12 MED ORDER — AMOXICILLIN-POT CLAVULANATE 875-125 MG PO TABS: 1.0000 | ORAL_TABLET | Freq: Two times a day (BID) | ORAL | 0 refills | Status: AC

## 2020-05-12 NOTE — Progress Notes (Signed)
Date:  05/12/2020   Name:  Cassidy Dawson   DOB:  [birth year]   MRN:  [id number]   Chief Complaint: Cough (Cough, head congestion and chills. This started Thursday night. Coughing. Had left over cough syrup with codeine and it helped. But she thinks she took to much and too late at night and she woke up Friday morning sick ( nauseous, and vomited ) . Clear mucous with cough. No fever that she knows of. No shortness of breathe. )  Sinus Problem This is a new problem. The current episode started in the past 7 days. There has been no fever. Associated symptoms include chills, congestion, coughing, headaches and sinus pressure. Pertinent negatives include no shortness of breath or sore throat.  Cough This is a new problem. The current episode started in the past 7 days. The problem occurs every few hours. The cough is non-productive. Associated symptoms include chills, headaches and postnasal drip. Pertinent negatives include no chest pain, fever, rhinorrhea, sore throat, shortness of breath or wheezing.  She has been taking Mucinex DM and drinking fluids.  Also has some Tussionex left over to use at bedtime.  Lab Results  Component Value Date   CREATININE 0.80 10/04/2019   BUN 10 10/04/2019   NA 140 10/04/2019   K 4.7 10/04/2019   CL 104 10/04/2019   CO2 24 10/04/2019   Lab Results  Component Value Date   CHOL 148 10/04/2019   HDL 32 (L) 10/04/2019   LDLCALC 88 10/04/2019   TRIG 162 (H) 10/04/2019   CHOLHDL 4.6 (H) 10/04/2019   Lab Results  Component Value Date   TSH 2.080 10/04/2019   Lab Results  Component Value Date   HGBA1C 6.0 (H) 10/04/2019   Lab Results  Component Value Date   WBC 6.8 10/04/2019   HGB 13.2 10/04/2019   HCT 41.2 10/04/2019   MCV 89 10/04/2019   PLT 424 10/04/2019   Lab Results  Component Value Date   ALT 16 10/04/2019   AST 13 10/04/2019   ALKPHOS 141 (H) 10/04/2019   BILITOT 0.4 10/04/2019     Review of Systems  Constitutional:  Positive for chills. Negative for appetite change, fatigue, fever and unexpected weight change.  HENT: Positive for congestion, postnasal drip and sinus pressure. Negative for ear discharge, facial swelling, rhinorrhea and sore throat.   Respiratory: Positive for cough. Negative for chest tightness, shortness of breath and wheezing.   Cardiovascular: Negative for chest pain, palpitations and leg swelling.  Neurological: Positive for headaches. Negative for dizziness and light-headedness.    Patient Active Problem List   Diagnosis Date Noted  . Plantar wart 05/02/2018  . Hypertriglyceridemia 05/01/2018  . Pre-diabetes 05/01/2018  . Systolic heart failure (HCC) 01/11/2017  . History of non-ST elevation myocardial infarction (NSTEMI) 01/11/2017  . Essential (primary) hypertension 08/14/2014  . Allergic rhinitis, seasonal 08/14/2014    Allergies  Allergen Reactions  . Codeine Nausea And Vomiting    Past Surgical History:  Procedure Laterality Date  . CORONARY ANGIOPLASTY WITH STENT PLACEMENT  01/2017   DES to LAD  . UTERINE FIBROID SURGERY  1996    Social History   Tobacco Use  . Smoking status: Never Smoker  . Smokeless tobacco: Never Used  Vaping Use  . Vaping Use: Never used  Substance Use Topics  . Alcohol use: Never    Alcohol/week: 0.0 standard drinks  . Drug use: Never     Medication list has been reviewed and updated.  Current Meds  Medication Sig  . amLODipine (NORVASC) 10 MG tablet Take 1 tablet (10 mg total) by mouth daily.  . Ascorbic Acid (VITAMIN C PO) Take by mouth daily.  Marland Kitchen aspirin 81 MG chewable tablet Chew 81 mg by mouth daily.  Marland Kitchen atorvastatin (LIPITOR) 80 MG tablet Take 1 tablet (80 mg total) by mouth daily.  . carvedilol (COREG) 25 MG tablet TAKE 1 TABLET(25 MG) BY MOUTH TWICE DAILY  . Cetirizine HCl 10 MG CAPS Take 1 capsule (10 mg total) by mouth daily. (Patient taking differently: Take 1 capsule by mouth daily. 1/2 tablet 5 mg)  .  Cyanocobalamin (VITAMIN B-12 PO) Take by mouth.  . cyclobenzaprine (FLEXERIL) 10 MG tablet Take 1 tablet (10 mg total) by mouth at bedtime.  . fluticasone (FLONASE) 50 MCG/ACT nasal spray SHAKE LIQUID AND USE 2 SPRAYS IN EACH NOSTRIL DAILY  . nitroGLYCERIN (NITROSTAT) 0.4 MG SL tablet Place 1 tablet (0.4 mg total) under the tongue every 5 (five) minutes as needed for chest pain.  Marland Kitchen VITAMIN D, CHOLECALCIFEROL, PO Take 1 tablet by mouth.    PHQ 2/9 Scores 05/12/2020 04/16/2020 04/08/2020 10/04/2019  PHQ - 2 Score 0 0 0 0  PHQ- 9 Score 0 0 0 0    GAD 7 : Generalized Anxiety Score 05/12/2020 04/16/2020 04/08/2020 10/04/2019  Nervous, Anxious, on Edge 0 0 0 0  Control/stop worrying 0 0 0 0  Worry too much - different things 0 0 0 0  Trouble relaxing 0 0 0 0  Restless 0 0 0 0  Easily annoyed or irritable 0 0 0 0  Afraid - awful might happen 0 0 0 0  Total GAD 7 Score 0 0 0 0  Anxiety Difficulty Not difficult at all - Not difficult at all Not difficult at all    BP Readings from Last 3 Encounters:  05/12/20 124/80  04/16/20 124/68  10/04/19 (!) 142/80    Physical Exam Constitutional:      Appearance: Normal appearance. She is well-developed and well-nourished.  HENT:     Right Ear: Ear canal and external ear normal. Tympanic membrane is not erythematous or retracted.     Left Ear: Ear canal and external ear normal. Tympanic membrane is not erythematous or retracted.     Nose:     Right Sinus: No maxillary sinus tenderness or frontal sinus tenderness.     Left Sinus: No maxillary sinus tenderness or frontal sinus tenderness.     Mouth/Throat:     Mouth: Mucous membranes are normal. No oral lesions.     Pharynx: Uvula midline. Posterior oropharyngeal erythema present. No oropharyngeal exudate.  Cardiovascular:     Rate and Rhythm: Normal rate and regular rhythm.     Pulses: Normal pulses.     Heart sounds: Normal heart sounds.  Pulmonary:     Effort: Pulmonary effort is normal.      Breath sounds: Normal breath sounds. No wheezing or rales.  Musculoskeletal:     Cervical back: Normal range of motion.  Lymphadenopathy:     Cervical: No cervical adenopathy.  Neurological:     Mental Status: She is alert and oriented to person, place, and time.     Wt Readings from Last 3 Encounters:  05/12/20 183 lb (83 kg)  04/16/20 185 lb (83.9 kg)  04/08/20 188 lb (85.3 kg)    BP 124/80   Pulse 62   Temp 98.4 F (36.9 C) (Oral)   Ht 5\' 2"  (1.575 m)  Wt 183 lb (83 kg)   SpO2 96%   BMI 33.47 kg/m   Assessment and Plan: 1. Acute recurrent sinusitis, unspecified location Continue Mucinex DM and fluids; Zyrtec Use Tussionex only at night; Delsym if needed during the day - amoxicillin-clavulanate (AUGMENTIN) 875-125 MG tablet; Take 1 tablet by mouth 2 (two) times daily for 10 days.  Dispense: 20 tablet; Refill: 0   Partially dictated using Animal nutritionist. Any errors are unintentional.  Bari Edward, MD [facility]  05/12/2020

## 2020-05-12 NOTE — Patient Instructions (Signed)
Delsym is a good over the counter cough syrup.  Mucinex DM is helpful.

## 2020-06-19 ENCOUNTER — Telehealth: Payer: Self-pay | Admitting: Internal Medicine

## 2020-06-19 NOTE — Telephone Encounter (Signed)
Left message for patient to call back and schedule Medicare Annual Wellness Visit (AWV) either virtually or in office. Whichever the patients preference is.  No history of AWV; please schedule at anytime with [facility]-Nurse Health Advisor.  This should be a 40 minute visit 

## 2020-06-25 ENCOUNTER — Ambulatory Visit: Payer: Medicare Other

## 2020-06-30 ENCOUNTER — Other Ambulatory Visit: Payer: Self-pay

## 2020-06-30 ENCOUNTER — Ambulatory Visit (INDEPENDENT_AMBULATORY_CARE_PROVIDER_SITE_OTHER): Payer: Medicare Other

## 2020-06-30 VITALS — BP 122/82 | HR 75 | Temp 98.3°F | Resp 16 | Ht 62.0 in | Wt 185.4 lb

## 2020-06-30 DIAGNOSIS — Z1211 Encounter for screening for malignant neoplasm of colon: Secondary | ICD-10-CM | POA: Diagnosis not present

## 2020-06-30 DIAGNOSIS — Z0000 Encounter for general adult medical examination without abnormal findings: Secondary | ICD-10-CM | POA: Diagnosis not present

## 2020-06-30 DIAGNOSIS — Z1231 Encounter for screening mammogram for malignant neoplasm of breast: Secondary | ICD-10-CM | POA: Diagnosis not present

## 2020-06-30 DIAGNOSIS — Z780 Asymptomatic menopausal state: Secondary | ICD-10-CM

## 2020-06-30 NOTE — Patient Instructions (Signed)
[name] , Thank you for taking time to come for your Medicare Wellness Visit. I appreciate your ongoing commitment to your health goals. Please review the following plan we discussed and let me know if I can assist you in the future.   Screening recommendations/referrals: Colonoscopy: Referral sent to [facility] today. They will contact you for an appointment.  Mammogram: done 10/18/18. Please call [phone] to schedule your mammogram and bone density screening.  Bone Density: due Recommended yearly ophthalmology/optometry visit for glaucoma screening and checkup Recommended yearly dental visit for hygiene and checkup  Vaccinations: Influenza vaccine: due Pneumococcal vaccine: done 10/04/19 due for Pneumovax23  Tdap vaccine: due Shingles vaccine: Shingrix discussed. Please contact your pharmacy for coverage information.  Covid-19:done 07/14/19 & 02/01/20  Advanced directives: Advance directive discussed with you today. I have provided a copy for you to complete at home and have notarized. Once this is complete please bring a copy in to our office so we can scan it into your chart.  Conditions/risks identified: Recommend increasing physical activity to at least 3 days per week   Next appointment: Follow up in one year for your annual wellness visit    Preventive Care 65 Years and Older, Female Preventive care refers to lifestyle choices and visits with your health care provider that can promote health and wellness. What does preventive care include?  A yearly physical exam. This is also called an annual well check.  Dental exams once or twice a year.  Routine eye exams. Ask your health care provider how often you should have your eyes checked.  Personal lifestyle choices, including:  Daily care of your teeth and gums.  Regular physical activity.  Eating a healthy diet.  Avoiding tobacco and drug use.  Limiting alcohol use.  Practicing safe sex.  Taking  low-dose aspirin every day.  Taking vitamin and mineral supplements as recommended by your health care provider. What happens during an annual well check? The services and screenings done by your health care provider during your annual well check will depend on your age, overall health, lifestyle risk factors, and family history of disease. Counseling  Your health care provider may ask you questions about your:  Alcohol use.  Tobacco use.  Drug use.  Emotional well-being.  Home and relationship well-being.  Sexual activity.  Eating habits.  History of falls.  Memory and ability to understand (cognition).  Work and work Astronomer.  Reproductive health. Screening  You may have the following tests or measurements:  Height, weight, and BMI.  Blood pressure.  Lipid and cholesterol levels. These may be checked every 5 years, or more frequently if you are over 20 years old.  Skin check.  Lung cancer screening. You may have this screening every year starting at age 82 if you have a 30-pack-year history of smoking and currently smoke or have quit within the past 15 years.  Fecal occult blood test (FOBT) of the stool. You may have this test every year starting at age 68.  Flexible sigmoidoscopy or colonoscopy. You may have a sigmoidoscopy every 5 years or a colonoscopy every 10 years starting at age 66.  Hepatitis C blood test.  Hepatitis B blood test.  Sexually transmitted disease (STD) testing.  Diabetes screening. This is done by checking your blood sugar (glucose) after you have not eaten for a while (fasting). You may have this done every 1-3 years.  Bone density scan. This is done to screen for osteoporosis. You may have this done  starting at age 28.  Mammogram. This may be done every 1-2 years. Talk to your health care provider about how often you should have regular mammograms. Talk with your health care provider about your test results, treatment options, and  if necessary, the need for more tests. Vaccines  Your health care provider may recommend certain vaccines, such as:  Influenza vaccine. This is recommended every year.  Tetanus, diphtheria, and acellular pertussis (Tdap, Td) vaccine. You may need a Td booster every 10 years.  Zoster vaccine. You may need this after age 37.  Pneumococcal 13-valent conjugate (PCV13) vaccine. One dose is recommended after age 6.  Pneumococcal polysaccharide (PPSV23) vaccine. One dose is recommended after age 11. Talk to your health care provider about which screenings and vaccines you need and how often you need them. This information is not intended to replace advice given to you by your health care provider. Make sure you discuss any questions you have with your health care provider. Document Released: 05/16/2015 Document Revised: 01/07/2016 Document Reviewed: 02/18/2015 Elsevier Interactive Patient Education  2017 [place] Prevention in the Home Falls can cause injuries. They can happen to people of all ages. There are many things you can do to make your home safe and to help prevent falls. What can I do on the outside of my home?  Regularly fix the edges of walkways and driveways and fix any cracks.  Remove anything that might make you trip as you walk through a door, such as a raised step or threshold.  Trim any bushes or trees on the path to your home.  Use bright outdoor lighting.  Clear any walking paths of anything that might make someone trip, such as rocks or tools.  Regularly check to see if handrails are loose or broken. Make sure that both sides of any steps have handrails.  Any raised decks and porches should have guardrails on the edges.  Have any leaves, snow, or ice cleared regularly.  Use sand or salt on walking paths during winter.  Clean up any spills in your garage right away. This includes oil or grease spills. What can I do in the bathroom?  Use night  lights.  Install grab bars by the toilet and in the tub and shower. Do not use towel bars as grab bars.  Use non-skid mats or decals in the tub or shower.  If you need to sit down in the shower, use a plastic, non-slip stool.  Keep the floor dry. Clean up any water that spills on the floor as soon as it happens.  Remove soap buildup in the tub or shower regularly.  Attach bath mats securely with double-sided non-slip rug tape.  Do not have throw rugs and other things on the floor that can make you trip. What can I do in the bedroom?  Use night lights.  Make sure that you have a light by your bed that is easy to reach.  Do not use any sheets or blankets that are too big for your bed. They should not hang down onto the floor.  Have a firm chair that has side arms. You can use this for support while you get dressed.  Do not have throw rugs and other things on the floor that can make you trip. What can I do in the kitchen?  Clean up any spills right away.  Avoid walking on wet floors.  Keep items that you use a lot in easy-to-reach places.  If you need to reach something above you, use a strong step stool that has a grab bar.  Keep electrical cords out of the way.  Do not use floor polish or wax that makes floors slippery. If you must use wax, use non-skid floor wax.  Do not have throw rugs and other things on the floor that can make you trip. What can I do with my stairs?  Do not leave any items on the stairs.  Make sure that there are handrails on both sides of the stairs and use them. Fix handrails that are broken or loose. Make sure that handrails are as long as the stairways.  Check any carpeting to make sure that it is firmly attached to the stairs. Fix any carpet that is loose or worn.  Avoid having throw rugs at the top or bottom of the stairs. If you do have throw rugs, attach them to the floor with carpet tape.  Make sure that you have a light switch at the  top of the stairs and the bottom of the stairs. If you do not have them, ask someone to add them for you. What else can I do to help prevent falls?  Wear shoes that:  Do not have high heels.  Have rubber bottoms.  Are comfortable and fit you well.  Are closed at the toe. Do not wear sandals.  If you use a stepladder:  Make sure that it is fully opened. Do not climb a closed stepladder.  Make sure that both sides of the stepladder are locked into place.  Ask someone to hold it for you, if possible.  Clearly mark and make sure that you can see:  Any grab bars or handrails.  First and last steps.  Where the edge of each step is.  Use tools that help you move around (mobility aids) if they are needed. These include:  Canes.  Walkers.  Scooters.  Crutches.  Turn on the lights when you go into a dark area. Replace any light bulbs as soon as they burn out.  Set up your furniture so you have a clear path. Avoid moving your furniture around.  If any of your floors are uneven, fix them.  If there are any pets around you, be aware of where they are.  Review your medicines with your doctor. Some medicines can make you feel dizzy. This can increase your chance of falling. Ask your doctor what other things that you can do to help prevent falls. This information is not intended to replace advice given to you by your health care provider. Make sure you discuss any questions you have with your health care provider. Document Released: 02/13/2009 Document Revised: 09/25/2015 Document Reviewed: 05/24/2014 Elsevier Interactive Patient Education  2017 [employer].

## 2020-06-30 NOTE — Progress Notes (Signed)
Subjective:   Cassidy Dawson is a 65 y.o. female who presents for an Initial Medicare Annual Wellness Visit.  Review of Systems     Cardiac Risk Factors include: advanced age (>21men, >42 women);hypertension;dyslipidemia     Objective:    Today's Vitals   06/30/20 0938  BP: 122/82  Pulse: 75  Resp: 16  Temp: 98.3 F (36.8 C)  TempSrc: Oral  SpO2: 97%  Weight: 185 lb 6.4 oz (84.1 kg)  Height: 5\' 2"  (1.575 m)   Body mass index is 33.91 kg/m.  Advanced Directives 06/30/2020  Does Patient Have a Medical Advance Directive? No  Would patient like information on creating a medical advance directive? Yes (MAU/Ambulatory/Procedural Areas - Information given)    Current Medications (verified) Outpatient Encounter Medications as of 06/30/2020  Medication Sig   amLODipine (NORVASC) 10 MG tablet Take 1 tablet (10 mg total) by mouth daily.   Ascorbic Acid (VITAMIN C PO) Take by mouth daily.   aspirin 81 MG chewable tablet Chew 81 mg by mouth daily.   carvedilol (COREG) 25 MG tablet TAKE 1 TABLET(25 MG) BY MOUTH TWICE DAILY   Cetirizine HCl 10 MG CAPS Take 1 capsule (10 mg total) by mouth daily. (Patient taking differently: Take 1 capsule by mouth daily. 1/2 tablet 5 mg)   Cyanocobalamin (VITAMIN B-12 PO) Take by mouth.   fluticasone (FLONASE) 50 MCG/ACT nasal spray SHAKE LIQUID AND USE 2 SPRAYS IN EACH NOSTRIL DAILY   nitroGLYCERIN (NITROSTAT) 0.4 MG SL tablet Place 1 tablet (0.4 mg total) under the tongue every 5 (five) minutes as needed for chest pain.   VITAMIN D, CHOLECALCIFEROL, PO Take 1 tablet by mouth.   atorvastatin (LIPITOR) 80 MG tablet Take 1 tablet (80 mg total) by mouth daily.   [DISCONTINUED] cyclobenzaprine (FLEXERIL) 10 MG tablet Take 1 tablet (10 mg total) by mouth at bedtime.   No facility-administered encounter medications on file as of 06/30/2020.    Allergies (verified) Codeine   History: Past Medical History:  Diagnosis Date    Environmental and seasonal allergies    Heart attack (HCC) 06/2016   Second: 01/2017    History of biliary T-tube placement 08/14/2014   Hyperlipidemia    Hypertension    Past Surgical History:  Procedure Laterality Date   CORONARY ANGIOPLASTY WITH STENT PLACEMENT  01/2017   DES to LAD   UTERINE FIBROID SURGERY  1996   Family History  Problem Relation Age of Onset   Hypertension Father    Kidney disease Father    Breast cancer Mother    Hypertension Brother    Renal Disease Brother    Social History   Socioeconomic History   Marital status: Married    Spouse name: Not on file   Number of children: 0   Years of education: Not on file   Highest education level: Not on file  Occupational History   Not on file  Tobacco Use   Smoking status: Never Smoker   Smokeless tobacco: Never Used  Vaping Use   Vaping Use: Never used  Substance and Sexual Activity   Alcohol use: Never    Alcohol/week: 0.0 standard drinks   Drug use: Never   Sexual activity: Not Currently  Other Topics Concern   Not on file  Social History Narrative   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[employer], 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   Primary caregiver for mother who lives in [place]   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[place]  [facility]   Financial Resource Strain: Low Risk    Difficulty of Paying Living Expenses: Not very hard  Food Insecurity: No Food Insecurity   Worried About Running Out of Food in the Last Year: Never true   Ran Out of Food in the Last Year: Never true  Transportation Needs: No Transportation Needs   Lack of Transportation (Medical): No   Lack of Transportation (Non-Medical): No  Physical Activity: Inactive   Days of Exercise per Week: 0 days   Minutes of Exercise per Session: 0 min  Stress: No Stress Concern Present   Feeling of Stress : Not at all  Social Connections: Moderately Integrated   Frequency of Communication with Friends and  Family: More than three times a week   Frequency of Social Gatherings with Friends and Family: More than three times a week   Attends Religious Services: More than 4 times per year   Active Member of [employer] or Organizations: No   Attends Engineer, structural: Never   Marital Status: Married    Tobacco Counseling Counseling given: Not Answered   Clinical Intake:  Pre-visit preparation completed: Yes  Pain : No/denies pain     BMI - recorded: 33.91 Nutritional Status: BMI > 30  Obese Nutritional Risks: None Diabetes: No  How often do you need to have someone help you when you read instructions, pamphlets, or other written materials from your doctor or pharmacy?: 1 - Never    Interpreter Needed?: No  Information entered by :: Reather Littler LPN   Activities of Daily Living In your present state of health, do you have any difficulty performing the following activities: 06/30/2020 04/08/2020  Hearing? Y N  Comment declines hearing aids -  Vision? N N  Difficulty concentrating or making decisions? N N  Walking or climbing stairs? N N  Dressing or bathing? N N  Doing errands, shopping? N N  Preparing Food and eating ? N -  Using the Toilet? N -  In the past six months, have you accidently leaked urine? Y -  Comment urge incontinence with cough/sneeze; wears pads for protection -  Do you have problems with loss of bowel control? N -  Managing your Medications? N -  Managing your Finances? N -  Housekeeping or managing your Housekeeping? N -  Some recent data might be hidden    Patient Care Team: Reubin Milan, MD as PCP - General (Internal Medicine)  Indicate any recent Medical Services you may have received from other than [facility] providers in the past year (date may be approximate).     Assessment:   This is a routine wellness examination for Cassidy Dawson.  Hearing/Vision screen  Hearing Screening   125Hz  250Hz  500Hz  1000Hz  2000Hz  3000Hz  4000Hz  6000Hz   8000Hz   Right ear:           Left ear:           Comments: Pt states hearing evaluation done at [facility] with mild hearing difficulty and may need hearing aid in the future  Vision Screening Comments: Annual vision screenings done at [address]  Dietary issues and exercise activities discussed: Current Exercise Habits: The patient has a physically strenuous job, but has no regular exercise apart from work., Exercise limited by: None identified  Goals     Increase physical activity     Recommend increasing physical activity to at least 3 days per week       Depression Screen PHQ 2/9 Scores  06/30/2020 05/12/2020 04/16/2020 04/08/2020 10/04/2019 01/22/2019 10/02/2018  PHQ - 2 Score 0 0 0 0 0 0 0  PHQ- 9 Score - 0 0 0 0 - -    Fall Risk Fall Risk  06/30/2020 05/12/2020 04/16/2020 04/08/2020 10/04/2019  Falls in the past year? 0 0 0 0 0  Number falls in past yr: 0 0 - 0 0  Injury with Fall? 0 0 - 0 0  Risk for fall due to : No Fall Risks - - - No Fall Risks  Follow up Falls prevention discussed Falls evaluation completed Falls evaluation completed Falls evaluation completed Falls evaluation completed    FALL RISK PREVENTION PERTAINING TO THE HOME:  Any stairs in or around the home? Yes  If so, are there any without handrails? Yes  - porch steps outside Home free of loose throw rugs in walkways, pet beds, electrical cords, etc? Yes  Adequate lighting in your home to reduce risk of falls? Yes   ASSISTIVE DEVICES UTILIZED TO PREVENT FALLS:  Life alert? No  Use of a cane, walker or w/c? No  Grab bars in the bathroom? Yes  Shower chair or bench in shower? No  Elevated toilet seat or a handicapped toilet? No   TIMED UP AND GO:  Was the test performed? Yes .  Length of time to ambulate 10 feet: 5 sec.   Gait steady and fast without use of assistive device  Cognitive Function: Normal cognitive status assessed by direct observation by this Nurse Health Advisor. No abnormalities  found.         Immunizations Immunization History  Administered Date(s) Administered   Influenza,inj,Quad PF,6+ Mos 06/28/2016, 05/02/2018, 01/22/2019   Janssen (J&J) SARS-COV-2 Vaccination 07/14/2019   PFIZER(Purple Top)SARS-COV-2 Vaccination 02/01/2020   Pneumococcal Conjugate-13 10/04/2019    TDAP status: Due, Education has been provided regarding the importance of this vaccine. Advised may receive this vaccine at local pharmacy or Health Dept. Aware to provide a copy of the vaccination record if obtained from local pharmacy or Health Dept. Verbalized acceptance and understanding.  Flu Vaccine status: Due, Education has been provided regarding the importance of this vaccine. Advised may receive this vaccine at local pharmacy or Health Dept. Aware to provide a copy of the vaccination record if obtained from local pharmacy or Health Dept. Verbalized acceptance and understanding.  Pneumococcal vaccine status: Up to date  Covid-19 vaccine status: Completed vaccines  Qualifies for Shingles Vaccine? Yes   Zostavax completed No   Shingrix Completed?: No.    Education has been provided regarding the importance of this vaccine. Patient has been advised to call insurance company to determine out of pocket expense if they have not yet received this vaccine. Advised may also receive vaccine at local pharmacy or Health Dept. Verbalized acceptance and understanding.  Screening Tests Health Maintenance  Topic Date Due   COLONOSCOPY (Pts 45-28yrs Insurance coverage will need to be confirmed)  Never done   DEXA SCAN  Never done   MAMMOGRAM  10/18/2019   INFLUENZA VACCINE  07/31/2020 (Originally 12/02/2019)   TETANUS/TDAP  04/16/2021 (Originally 01/26/1975)   PNA vac Low Risk Adult (2 of 2 - PPSV23) 10/03/2020   COVID-19 Vaccine  Completed   Hepatitis C Screening  Completed    Health Maintenance  Health Maintenance Due  Topic Date Due   COLONOSCOPY (Pts 45-15yrs Insurance  coverage will need to be confirmed)  Never done   DEXA SCAN  Never done   MAMMOGRAM  10/18/2019  Colorectal cancer screening: Referral to GI placed today . Pt aware the office will call re: appt.  Mammogram status: Completed 10/18/18. Repeat every year  Bone Density status: Ordered today. Pt provided with contact info and advised to call to schedule appt.  Lung Cancer Screening: (Low Dose CT Chest recommended if Age 15-80 years, 30 pack-year currently smoking OR have quit w/in 15years.) does not qualify.   Additional Screening:  Hepatitis C Screening: does qualify; Completed 10/02/18  Vision Screening: Recommended annual ophthalmology exams for early detection of glaucoma and other disorders of the eye. Is the patient up to date with their annual eye exam?  No  Who is the provider or what is the name of the office in which the patient attends annual eye exams? Dr. Lynita Lombard  Dental Screening: Recommended annual dental exams for proper oral hygiene  Community Resource Referral / Chronic Care Management: CRR required this visit?  No   CCM required this visit?  No      Plan:     I have personally reviewed and noted the following in the patients chart:    Medical and social history  Use of alcohol, tobacco or illicit drugs   Current medications and supplements  Functional ability and status  Nutritional status  Physical activity  Advanced directives  List of other physicians  Hospitalizations, surgeries, and ER visits in previous 12 months  Vitals  Screenings to include cognitive, depression, and falls  Referrals and appointments  In addition, I have reviewed and discussed with patient certain preventive protocols, quality metrics, and best practice recommendations. A written personalized care plan for preventive services as well as general preventive health recommendations were provided to patient.     Reather Littler, LPN   10/22/2977   Nurse Notes:  none

## 2020-07-03 ENCOUNTER — Other Ambulatory Visit: Payer: Self-pay

## 2020-07-03 DIAGNOSIS — R928 Other abnormal and inconclusive findings on diagnostic imaging of breast: Secondary | ICD-10-CM

## 2020-07-23 ENCOUNTER — Other Ambulatory Visit: Payer: Medicare Other

## 2020-07-25 ENCOUNTER — Other Ambulatory Visit: Payer: Self-pay | Admitting: Internal Medicine

## 2020-08-06 ENCOUNTER — Telehealth: Payer: Self-pay

## 2020-08-06 NOTE — Telephone Encounter (Signed)
Copied from CRM [id number]. Topic: Referral - Request for Referral >> Aug 06, 2020  4:14 PM Aretta Nip wrote: Has patient seen PCP for this complaint? Pt states this was discussed at her 1/10 appt but was not ordered  Referral for which specialty: colonoscopy @ [facility]. Call back from nurse if this is not too late? [phone]

## 2020-08-07 NOTE — Telephone Encounter (Signed)
Pt returned missed call. I let her know about appts on 4/12. She stated she would rather go to [facility] for colonoscopy, mammo with [place] and bone density and is okay if there will be a longer wait. Requesting to be notified of when appointments have been scheduled.

## 2020-08-07 NOTE — Telephone Encounter (Signed)
Called and left VM letting patient know she is already scheduled for her bone density, mammogram with [place] at [facility] on 08/12/2020 at 220PM. I told her on the VM if we refer her somewhere else then this can take some time to get in her in so we recommend her keeping her appt on 04/12.  Told her to call back with any questions.

## 2020-08-08 ENCOUNTER — Other Ambulatory Visit: Payer: Self-pay

## 2020-08-08 DIAGNOSIS — Z1211 Encounter for screening for malignant neoplasm of colon: Secondary | ICD-10-CM

## 2020-08-08 NOTE — Telephone Encounter (Signed)
Called and spoke with patient - Informed her that after reviewing her chart further she has not had a mammogram since 2020. Explained to her that they did find a mass of the Rt breast in 2020 and it is not wise per Dr. Judithann Graves to wait ANY longer to get this done. Since she is scheduled for 4 days from now , Dr Judithann Graves HIGHLY recommends her keeping her upcoming appt and using [facility] for her next mammo.   She said she does not trust and is not "comfortable" with [facility] doing her mammogram. She then said because Dr. Judithann Graves highly recommends keeping the appt - she will. Informed her I can place a referral for colonoscopy to [facility] as she requested. Placed referral.

## 2020-08-12 ENCOUNTER — Ambulatory Visit
Admission: RE | Admit: 2020-08-12 | Discharge: 2020-08-12 | Disposition: A | Discharge status: Home / Self Care | Payer: Medicare Other | Source: Ambulatory Visit | Attending: Internal Medicine | Admitting: Internal Medicine

## 2020-08-12 ENCOUNTER — Other Ambulatory Visit: Payer: Self-pay

## 2020-08-12 DIAGNOSIS — R922 Inconclusive mammogram: Secondary | ICD-10-CM | POA: Diagnosis not present

## 2020-08-12 DIAGNOSIS — Z780 Asymptomatic menopausal state: Secondary | ICD-10-CM | POA: Insufficient documentation

## 2020-08-12 DIAGNOSIS — R928 Other abnormal and inconclusive findings on diagnostic imaging of breast: Secondary | ICD-10-CM | POA: Insufficient documentation

## 2020-09-03 DIAGNOSIS — H25013 Cortical age-related cataract, bilateral: Secondary | ICD-10-CM | POA: Diagnosis not present

## 2020-10-07 ENCOUNTER — Encounter: Payer: Medicare Other | Admitting: Internal Medicine

## 2020-10-07 NOTE — Progress Notes (Signed)
Date:  10/08/2020   Name:  Cassidy Dawson   DOB:  Jan 18, 1956   MRN:  [id number]   Chief Complaint: Annual Exam (Breast Exam.)  Cassidy Dawson is a 65 y.o. female who presents today for her Complete Annual Exam. She feels well. She reports exercising at work. She reports she is sleeping well. Breast complaints none.  Mammogram: 08/2020 stable right breast mass DEXA: 08/2020 Normal Pap smear: discontinued Colonoscopy: overdue - FIT given last year not completed. [employer] nurse came to her house and gave her one in April which she submitted.  Immunization History  Administered Date(s) Administered  . Influenza,inj,Quad PF,6+ Mos 06/28/2016, 05/02/2018, 01/22/2019  . Janssen (J&J) SARS-COV-2 Vaccination 07/14/2019  . PFIZER(Purple Top)SARS-COV-2 Vaccination 02/01/2020  . Pneumococcal Conjugate-13 10/04/2019   CAD - she is s/p DES to LAD in 06/2016.  She has not had cardiology follow up since 12/2016 at [facility].  She is SOB here today after walking in from the parking lot - she states she was rushing.  She missed her last appt due to office being relocated - she has not received a call back to reschedule.  Hypertension This is a chronic problem. The problem is controlled. Associated symptoms include shortness of breath (on exertion). Pertinent negatives include no chest pain, headaches or palpitations. Past treatments include beta blockers and calcium channel blockers. The current treatment provides significant improvement.  Hyperlipidemia This is a chronic problem. The problem is controlled. Associated symptoms include shortness of breath (on exertion). Pertinent negatives include no chest pain. Current antihyperlipidemic treatment includes statins. The current treatment provides significant improvement of lipids.  Diabetes She presents for her follow-up diabetic visit. Diabetes type: prediabetes. Pertinent negatives for hypoglycemia include no dizziness, headaches,  nervousness/anxiousness or tremors. Pertinent negatives for diabetes include no chest pain, no fatigue, no polydipsia and no polyuria.    Lab Results  Component Value Date   CREATININE 0.80 10/04/2019   BUN 10 10/04/2019   NA 140 10/04/2019   K 4.7 10/04/2019   CL 104 10/04/2019   CO2 24 10/04/2019   Lab Results  Component Value Date   CHOL 148 10/04/2019   HDL 32 (L) 10/04/2019   LDLCALC 88 10/04/2019   TRIG 162 (H) 10/04/2019   CHOLHDL 4.6 (H) 10/04/2019   Lab Results  Component Value Date   TSH 2.080 10/04/2019   Lab Results  Component Value Date   HGBA1C 6.0 (H) 10/04/2019   Lab Results  Component Value Date   WBC 6.8 10/04/2019   HGB 13.2 10/04/2019   HCT 41.2 10/04/2019   MCV 89 10/04/2019   PLT 424 10/04/2019   Lab Results  Component Value Date   ALT 16 10/04/2019   AST 13 10/04/2019   ALKPHOS 141 (H) 10/04/2019   BILITOT 0.4 10/04/2019     Review of Systems  Constitutional: Negative for chills, fatigue and fever.  HENT: Negative for congestion, hearing loss, tinnitus, trouble swallowing and voice change.   Eyes: Negative for visual disturbance.  Respiratory: Positive for shortness of breath (on exertion). Negative for cough, chest tightness and wheezing.   Cardiovascular: Negative for chest pain, palpitations and leg swelling.  Gastrointestinal: Negative for abdominal pain, constipation, diarrhea and vomiting.  Endocrine: Negative for polydipsia and polyuria.  Genitourinary: Negative for dysuria, frequency, genital sores, vaginal bleeding and vaginal discharge.  Musculoskeletal: Negative for arthralgias, gait problem and joint swelling.  Skin: Negative for color change and rash.  Neurological: Negative for dizziness, tremors, light-headedness and  headaches.  Hematological: Negative for adenopathy. Does not bruise/bleed easily.  Psychiatric/Behavioral: Negative for dysphoric mood and sleep disturbance. The patient is not nervous/anxious.     Patient  Active Problem List   Diagnosis Date Noted  . Plantar wart 05/02/2018  . Hypertriglyceridemia 05/01/2018  . Pre-diabetes 05/01/2018  . Systolic heart failure (HCC) 01/11/2017  . History of non-ST elevation myocardial infarction (NSTEMI) 01/11/2017  . Essential (primary) hypertension 08/14/2014  . Allergic rhinitis, seasonal 08/14/2014    Allergies  Allergen Reactions  . Codeine Nausea And Vomiting    Past Surgical History:  Procedure Laterality Date  . CORONARY ANGIOPLASTY WITH STENT PLACEMENT  01/2017   DES to LAD  . UTERINE FIBROID SURGERY  1996    Social History   Tobacco Use  . Smoking status: Never Smoker  . Smokeless tobacco: Never Used  Vaping Use  . Vaping Use: Never used  Substance Use Topics  . Alcohol use: Never    Alcohol/week: 0.0 standard drinks  . Drug use: Never     Medication list has been reviewed and updated.  Current Meds  Medication Sig  . amLODipine (NORVASC) 10 MG tablet Take 1 tablet (10 mg total) by mouth daily.  . Ascorbic Acid (VITAMIN C PO) Take by mouth daily.  Marland Kitchen aspirin 81 MG chewable tablet Chew 81 mg by mouth daily.  Marland Kitchen atorvastatin (LIPITOR) 80 MG tablet Take 1 tablet (80 mg total) by mouth daily.  . carvedilol (COREG) 25 MG tablet TAKE 1 TABLET(25 MG) BY MOUTH TWICE DAILY  . cetirizine (ZYRTEC) 10 MG tablet TAKE 1 TABLET BY MOUTH DAILY  . Cyanocobalamin (VITAMIN B-12 PO) Take by mouth.  . fluticasone (FLONASE) 50 MCG/ACT nasal spray SHAKE LIQUID AND USE 2 SPRAYS IN EACH NOSTRIL DAILY  . Multiple Vitamin (MULTI-VITAMIN PO) Take by mouth.  . nitroGLYCERIN (NITROSTAT) 0.4 MG SL tablet Place 1 tablet (0.4 mg total) under the tongue every 5 (five) minutes as needed for chest pain.  Marland Kitchen VITAMIN D, CHOLECALCIFEROL, PO Take 1 tablet by mouth.    PHQ 2/9 Scores 10/08/2020 06/30/2020 05/12/2020 04/16/2020  PHQ - 2 Score 0 0 0 0  PHQ- 9 Score 1 - 0 0    GAD 7 : Generalized Anxiety Score 10/08/2020 05/12/2020 04/16/2020 04/08/2020  Nervous,  Anxious, on Edge 0 0 0 0  Control/stop worrying 0 0 0 0  Worry too much - different things 0 0 0 0  Trouble relaxing 0 0 0 0  Restless 0 0 0 0  Easily annoyed or irritable 0 0 0 0  Afraid - awful might happen 0 0 0 0  Total GAD 7 Score 0 0 0 0  Anxiety Difficulty - Not difficult at all - Not difficult at all    BP Readings from Last 3 Encounters:  10/08/20 130/72  06/30/20 122/82  05/12/20 124/80    Physical Exam Vitals and nursing note reviewed.  Constitutional:      General: She is not in acute distress.    Appearance: She is well-developed.  HENT:     Head: Normocephalic and atraumatic.     Right Ear: Tympanic membrane and ear canal normal.     Left Ear: Tympanic membrane and ear canal normal.     Nose:     Right Sinus: No maxillary sinus tenderness.     Left Sinus: No maxillary sinus tenderness.  Eyes:     General: No scleral icterus.       Right eye: No discharge.  Left eye: No discharge.     Conjunctiva/sclera: Conjunctivae normal.  Neck:     Thyroid: No thyromegaly.     Vascular: No carotid bruit.  Cardiovascular:     Rate and Rhythm: Normal rate and regular rhythm.     Pulses: Normal pulses.     Heart sounds: Normal heart sounds.  Pulmonary:     Effort: Pulmonary effort is normal. No respiratory distress.     Breath sounds: No wheezing.  Chest:  Breasts:     Right: No mass, nipple discharge, skin change or tenderness.     Left: No mass, nipple discharge, skin change or tenderness.    Abdominal:     General: Bowel sounds are normal.     Palpations: Abdomen is soft.     Tenderness: There is no abdominal tenderness.  Musculoskeletal:     Cervical back: Normal range of motion. No erythema.     Right lower leg: No edema.     Left lower leg: No edema.  Lymphadenopathy:     Cervical: No cervical adenopathy.  Skin:    General: Skin is warm and dry.     Findings: No rash.  Neurological:     Mental Status: She is alert and oriented to person, place,  and time.     Cranial Nerves: No cranial nerve deficit.     Sensory: No sensory deficit.     Deep Tendon Reflexes: Reflexes are normal and symmetric.  Psychiatric:        Attention and Perception: Attention normal.        Mood and Affect: Mood normal.     Wt Readings from Last 3 Encounters:  10/08/20 184 lb (83.5 kg)  06/30/20 185 lb 6.4 oz (84.1 kg)  05/12/20 183 lb (83 kg)    BP 130/72   Pulse 75   Temp 98.6 F (37 C) (Oral)   Ht 5\' 2"  (1.575 m)   Wt 184 lb (83.5 kg)   BMI 33.65 kg/m   Assessment and Plan: 1. Annual physical exam Exam is normal except for weight. Encourage regular exercise as tolerated after cardiac evaluation and appropriate dietary changes.  2. Colon cancer screening Pt submitted FIT through [facility] with [facility] Nurse - results have not been received here or by the patient.  She will call us when she received them.  3. Essential (primary) hypertension Clinically stable exam with well controlled BP. Tolerating medications without side effects at this time. Pt to continue current regimen and low sodium diet;  - CBC with Differential/Platelet - Comprehensive metabolic panel - TSH - POCT urinalysis dipstick - amLODipine (NORVASC) 10 MG tablet; Take 1 tablet (10 mg total) by mouth daily.  Dispense: 90 tablet; Refill: 3 - carvedilol (COREG) 25 MG tablet; Take 1 tablet (25 mg total) by mouth 2 (two) times daily with a meal.  Dispense: 180 tablet; Refill: 3  4. Pre-diabetes Check labs and advise - Hemoglobin A1c  5. Hypertriglyceridemia Tolerating statin medication without side effects at this time LDL is not quite at goal of < 70 on current dose Continue same therapy without change at this time. Consider change to Crestor. - Lipid panel - atorvastatin (LIPITOR) 80 MG tablet; Take 1 tablet (80 mg total) by mouth daily.  Dispense: 90 tablet; Refill: 3  6. Coronary artery disease of native artery of native heart with stable angina pectoris (HCC) Pt  is encouraged to schedule cardiology follow up ASAP Continue current regimen at this time with beta blocker  and aspirin  7. Need for vaccination for pneumococcus - Pneumococcal polysaccharide vaccine 23-valent greater than or equal to 2yo subcutaneous/IM  8. Systolic heart failure, unspecified HF chronicity (HCC) Appears compensated; continue beta blocker Follow up with Cardiology   Partially dictated using Dragon software. Any errors are unintentional.  Bari Edward, MD [facility]  10/08/2020

## 2020-10-08 ENCOUNTER — Other Ambulatory Visit: Payer: Self-pay

## 2020-10-08 ENCOUNTER — Encounter: Payer: Self-pay | Admitting: Internal Medicine

## 2020-10-08 ENCOUNTER — Ambulatory Visit (INDEPENDENT_AMBULATORY_CARE_PROVIDER_SITE_OTHER): Payer: Medicare Other | Admitting: Internal Medicine

## 2020-10-08 VITALS — BP 130/72 | HR 75 | Temp 98.6°F | Ht 62.0 in | Wt 184.0 lb

## 2020-10-08 DIAGNOSIS — Z0000 Encounter for general adult medical examination without abnormal findings: Secondary | ICD-10-CM | POA: Diagnosis not present

## 2020-10-08 DIAGNOSIS — Z23 Encounter for immunization: Secondary | ICD-10-CM

## 2020-10-08 DIAGNOSIS — E781 Pure hyperglyceridemia: Secondary | ICD-10-CM

## 2020-10-08 DIAGNOSIS — I5020 Unspecified systolic (congestive) heart failure: Secondary | ICD-10-CM

## 2020-10-08 DIAGNOSIS — I25118 Atherosclerotic heart disease of native coronary artery with other forms of angina pectoris: Secondary | ICD-10-CM | POA: Diagnosis not present

## 2020-10-08 DIAGNOSIS — R7303 Prediabetes: Secondary | ICD-10-CM

## 2020-10-08 DIAGNOSIS — I10 Essential (primary) hypertension: Secondary | ICD-10-CM

## 2020-10-08 DIAGNOSIS — Z1211 Encounter for screening for malignant neoplasm of colon: Secondary | ICD-10-CM | POA: Diagnosis not present

## 2020-10-08 LAB — POCT URINALYSIS DIPSTICK
Bilirubin, UA: NEGATIVE
Blood, UA: NEGATIVE
Glucose, UA: NEGATIVE
Ketones, UA: NEGATIVE
Leukocytes, UA: NEGATIVE
Nitrite, UA: NEGATIVE
Protein, UA: NEGATIVE
Spec Grav, UA: >=1.03 — AB (ref 1.010–1.025)
Urobilinogen, UA: 0.2 E.U./dL
pH, UA: 5 (ref 5.0–8.0)

## 2020-10-08 MED ORDER — FLUTICASONE PROPIONATE 50 MCG/ACT NA SUSP: 2.0000 | Freq: Every day | NASAL | 1 refills | Status: DC

## 2020-10-08 MED ORDER — ATORVASTATIN CALCIUM 80 MG PO TABS: 80.0000 mg | ORAL_TABLET | Freq: Every day | ORAL | 3 refills | Status: DC

## 2020-10-08 MED ORDER — AMLODIPINE BESYLATE 10 MG PO TABS: 10.0000 mg | ORAL_TABLET | Freq: Every day | ORAL | 3 refills | Status: DC

## 2020-10-08 MED ORDER — CARVEDILOL 25 MG PO TABS: 25.0000 mg | ORAL_TABLET | Freq: Two times a day (BID) | ORAL | 3 refills | Status: DC

## 2020-10-08 NOTE — Patient Instructions (Addendum)
Schedule appointment with Cardiology. Brooks Sailors, MD  [address]  [address] [place]  [place], [place] [address]  [phone]  [phone] (Fax)

## 2020-10-09 LAB — COMPREHENSIVE METABOLIC PANEL
ALT: 11 IU/L (ref 0–32)
AST: 14 IU/L (ref 0–40)
Albumin/Globulin Ratio: 2 (ref 1.2–2.2)
Albumin: 4.7 g/dL (ref 3.8–4.8)
Alkaline Phosphatase: 123 IU/L — ABNORMAL HIGH (ref 44–121)
BUN/Creatinine Ratio: 18 (ref 12–28)
BUN: 15 mg/dL (ref 8–27)
Bilirubin Total: 0.5 mg/dL (ref 0.0–1.2)
CO2: 24 mmol/L (ref 20–29)
Calcium: 9.5 mg/dL (ref 8.7–10.3)
Chloride: 104 mmol/L (ref 96–106)
Creatinine, Ser: 0.85 mg/dL (ref 0.57–1.00)
Globulin, Total: 2.4 g/dL (ref 1.5–4.5)
Glucose: 80 mg/dL (ref 65–99)
Potassium: 5.1 mmol/L (ref 3.5–5.2)
Sodium: 143 mmol/L (ref 134–144)
Total Protein: 7.1 g/dL (ref 6.0–8.5)
eGFR: 76 mL/min/{1.73_m2} (ref >59)

## 2020-10-09 LAB — CBC WITH DIFFERENTIAL/PLATELET
Basophils Absolute: 0.1 10*3/uL (ref 0.0–0.2)
Basos: 1 %
EOS (ABSOLUTE): 0.2 10*3/uL (ref 0.0–0.4)
Eos: 2 %
Hematocrit: 40.3 % (ref 34.0–46.6)
Hemoglobin: 12.7 g/dL (ref 11.1–15.9)
Immature Grans (Abs): 0 10*3/uL (ref 0.0–0.1)
Immature Granulocytes: 0 %
Lymphocytes Absolute: 2.2 10*3/uL (ref 0.7–3.1)
Lymphs: 30 %
MCH: 27.9 pg (ref 26.6–33.0)
MCHC: 31.5 g/dL (ref 31.5–35.7)
MCV: 89 fL (ref 79–97)
Monocytes Absolute: 0.6 10*3/uL (ref 0.1–0.9)
Monocytes: 8 %
Neutrophils Absolute: 4.5 10*3/uL (ref 1.4–7.0)
Neutrophils: 59 %
Platelets: 387 10*3/uL (ref 150–450)
RBC: 4.55 x10E6/uL (ref 3.77–5.28)
RDW: 13.9 % (ref 11.7–15.4)
WBC: 7.5 10*3/uL (ref 3.4–10.8)

## 2020-10-09 LAB — TSH: TSH: 1.73 u[IU]/mL (ref 0.450–4.500)

## 2020-10-09 LAB — LIPID PANEL
Chol/HDL Ratio: 4.9 ratio — ABNORMAL HIGH (ref 0.0–4.4)
Cholesterol, Total: 176 mg/dL (ref 100–199)
HDL: 36 mg/dL — ABNORMAL LOW (ref >39)
LDL Chol Calc (NIH): 115 mg/dL — ABNORMAL HIGH (ref 0–99)
Triglycerides: 141 mg/dL (ref 0–149)
VLDL Cholesterol Cal: 25 mg/dL (ref 5–40)

## 2020-10-09 LAB — HEMOGLOBIN A1C
Est. average glucose Bld gHb Est-mCnc: 126 mg/dL
Hgb A1c MFr Bld: 6 % — ABNORMAL HIGH (ref 4.8–5.6)

## 2020-12-12 ENCOUNTER — Other Ambulatory Visit: Payer: Self-pay | Admitting: Internal Medicine

## 2020-12-12 DIAGNOSIS — I252 Old myocardial infarction: Secondary | ICD-10-CM

## 2020-12-12 NOTE — Telephone Encounter (Signed)
Medication discontinued 10/08/20,. Change in therapy

## 2021-01-02 ENCOUNTER — Ambulatory Visit: Payer: Medicare Other | Admitting: Internal Medicine

## 2021-01-02 DIAGNOSIS — J811 Chronic pulmonary edema: Secondary | ICD-10-CM | POA: Diagnosis not present

## 2021-01-02 DIAGNOSIS — R000 Tachycardia, unspecified: Secondary | ICD-10-CM | POA: Diagnosis not present

## 2021-01-02 DIAGNOSIS — I252 Old myocardial infarction: Secondary | ICD-10-CM | POA: Diagnosis not present

## 2021-01-02 DIAGNOSIS — I2510 Atherosclerotic heart disease of native coronary artery without angina pectoris: Secondary | ICD-10-CM | POA: Diagnosis not present

## 2021-01-02 DIAGNOSIS — J810 Acute pulmonary edema: Secondary | ICD-10-CM

## 2021-01-02 DIAGNOSIS — J9601 Acute respiratory failure with hypoxia: Secondary | ICD-10-CM | POA: Diagnosis not present

## 2021-01-02 DIAGNOSIS — Z20822 Contact with and (suspected) exposure to covid-19: Secondary | ICD-10-CM | POA: Diagnosis not present

## 2021-01-02 DIAGNOSIS — E782 Mixed hyperlipidemia: Secondary | ICD-10-CM | POA: Diagnosis not present

## 2021-01-02 DIAGNOSIS — R918 Other nonspecific abnormal finding of lung field: Secondary | ICD-10-CM | POA: Diagnosis not present

## 2021-01-02 DIAGNOSIS — I509 Heart failure, unspecified: Secondary | ICD-10-CM | POA: Diagnosis not present

## 2021-01-02 DIAGNOSIS — I499 Cardiac arrhythmia, unspecified: Secondary | ICD-10-CM | POA: Diagnosis not present

## 2021-01-02 DIAGNOSIS — J302 Other seasonal allergic rhinitis: Secondary | ICD-10-CM | POA: Diagnosis not present

## 2021-01-02 DIAGNOSIS — Z7982 Long term (current) use of aspirin: Secondary | ICD-10-CM | POA: Diagnosis not present

## 2021-01-02 DIAGNOSIS — Z7902 Long term (current) use of antithrombotics/antiplatelets: Secondary | ICD-10-CM | POA: Diagnosis not present

## 2021-01-02 DIAGNOSIS — Z79899 Other long term (current) drug therapy: Secondary | ICD-10-CM | POA: Diagnosis not present

## 2021-01-02 DIAGNOSIS — I5023 Acute on chronic systolic (congestive) heart failure: Secondary | ICD-10-CM | POA: Diagnosis not present

## 2021-01-02 DIAGNOSIS — Z95818 Presence of other cardiac implants and grafts: Secondary | ICD-10-CM | POA: Diagnosis not present

## 2021-01-02 DIAGNOSIS — R0602 Shortness of breath: Secondary | ICD-10-CM | POA: Diagnosis not present

## 2021-01-02 DIAGNOSIS — Z955 Presence of coronary angioplasty implant and graft: Secondary | ICD-10-CM | POA: Diagnosis not present

## 2021-01-02 DIAGNOSIS — Z885 Allergy status to narcotic agent status: Secondary | ICD-10-CM | POA: Diagnosis not present

## 2021-01-02 DIAGNOSIS — R7303 Prediabetes: Secondary | ICD-10-CM | POA: Diagnosis not present

## 2021-01-02 DIAGNOSIS — I161 Hypertensive emergency: Secondary | ICD-10-CM | POA: Diagnosis not present

## 2021-01-02 DIAGNOSIS — J9600 Acute respiratory failure, unspecified whether with hypoxia or hypercapnia: Secondary | ICD-10-CM | POA: Diagnosis not present

## 2021-01-02 DIAGNOSIS — I10 Essential (primary) hypertension: Secondary | ICD-10-CM | POA: Diagnosis not present

## 2021-01-02 HISTORY — DX: Acute pulmonary edema: J81.0

## 2021-01-07 ENCOUNTER — Telehealth: Payer: Self-pay

## 2021-01-07 NOTE — Telephone Encounter (Signed)
Transition Care Management Unsuccessful Follow-up Telephone Call  Date of discharge and from where:  01/05/21 [facility], [place]  Attempts:  1st Attempt  Reason for unsuccessful TCM follow-up call:  Left voice message

## 2021-01-09 NOTE — Telephone Encounter (Signed)
Transition Care Management Follow-up Telephone Call Date of discharge and from where: 01/05/21 [facility] How have you been since you were released from the hospital? Pt states she is doing well Any questions or concerns? No  Items Reviewed: Did the pt receive and understand the discharge instructions provided? Yes  Medications obtained and verified? Yes  Other? No  Any new allergies since your discharge? No  Dietary orders reviewed? Yes Do you have support at home? Yes   Home Care and Equipment/Supplies: Were home health services ordered? no Were any new equipment or medical supplies ordered?  No  Functional Questionnaire: (I = Independent and D = Dependent) ADLs: I  Bathing/Dressing- I  Meal Prep- I  Eating- I  Maintaining continence- I  Transferring/Ambulation- I  Managing Meds- I  Follow up appointments reviewed:  PCP Hospital f/u appt confirmed? Yes  Scheduled to see Dr. Judithann Graves on 01/14/21 @ 2:00. Specialist Hospital f/u appt confirmed? Yes  Scheduled to see cardiology with [facility] on 01/27/21. Are transportation arrangements needed? No  If their condition worsens, is the pt aware to call PCP or go to the Emergency Dept.? Yes Was the patient provided with contact information for the PCP's office or ED? Yes Was to pt encouraged to call back with questions or concerns? Yes

## 2021-01-14 ENCOUNTER — Encounter: Payer: Self-pay | Admitting: Internal Medicine

## 2021-01-14 ENCOUNTER — Ambulatory Visit (INDEPENDENT_AMBULATORY_CARE_PROVIDER_SITE_OTHER): Payer: Medicare Other | Admitting: Internal Medicine

## 2021-01-14 ENCOUNTER — Other Ambulatory Visit: Payer: Self-pay

## 2021-01-14 VITALS — BP 124/72 | HR 74 | Ht 62.0 in | Wt 184.0 lb

## 2021-01-14 DIAGNOSIS — I10 Essential (primary) hypertension: Secondary | ICD-10-CM | POA: Diagnosis not present

## 2021-01-14 DIAGNOSIS — Z23 Encounter for immunization: Secondary | ICD-10-CM

## 2021-01-14 DIAGNOSIS — J810 Acute pulmonary edema: Secondary | ICD-10-CM | POA: Diagnosis not present

## 2021-01-14 NOTE — Progress Notes (Signed)
Date:  01/14/2021   Name:  Cassidy Dawson   DOB:  [birth year]   MRN:  [id number]   Chief Complaint: Hospitalization Follow-up Hospital follow up.  Admitted to [facility]  01/02/21 - 01/05/21 for flash pulmonary edema and hypertensive urgency.  TOC call on 01/07/21.  She has been home for the past week.  She feels well, mildly fatigued but no SOB.  Tolerating medications well without SE.  Limiting her sodium.  Activity as tolerated around the home - has not returned to work.  HPI Flash pulmonary edemaSevere acute respiratory failureHx HFrEF Patient presented with SOB, occasional cough and increased blood pressure. She was not able to speak given the severity of acute respiratory failure. She denies chest pain, headache, productive cough, swelling, problems with urination or bowel movements. CXR showed pulmonary edema with bilateral effusion and bilateral airspace disease. EKG showed nonspecific T wave inversions in anterior leads. D-dimer was elevated to 673. Troponinc were in 60s. Echocardiogram showed EF of 40-45%. WBC are trending up and patient became febrile in the afternoon. Differentials for her are Hypertensive emergency along with HF exacerbation, Acute respiratory failure due to pulmonary infection, Flash pulmonary edema due to malignant hypertension. Empiric antibiotic treatment was started with ceftriaxone and azithromycin. Nitroglycerin drip that was started in ED was stopped given the fact that patient's blood pressure decreased by 10-20% over the past couple of hours. The next day Captopril 6.25 PO TID, Aspirin 81 mg PO, COREG 3.125 PO BID, Atorvastatin 80 mg PO, and furosemide 40 mg IV were given. She had one episode of fever and increase in WBCs once, that's why antibiotic therapy was initiated, but given the fact that she has not had any other signs of infections and she has been afebrile, empiric Oral antibiotic therapy was initiated with Augmentin 875 mg BID, azithromycin 500 mg PO.  Blood cultures were negative. Troponins were increased to 60s and 80s on admission, upon discharge Troponin levels trended down. Lower respiratory panel was negative. Urine culture was negative. Patient was put on BiPAP initially and after she stabilized, she was switched to nasal canula 2 L. TSH was within normal range. Before discharge, she was able to breath normally on room air, did not have SOB, chest pain, headaches or dizziness, was able to engage in the conversation.   Hypertensive emergency  Patient's blood pressure was in 200s when she came to ED. She was put on nitroglycerin drip. Blood pressure decreased from 207/97 to 151/78, then to 134/75. Patient is able to engage in the conversation and her breathing improved. Cardiac monitoring was continued. GDMT was started. Given the history of HFrEF and hypertension, she was discharged on GDMT: COREG 6.26 PO BID, Furosemide 40 mg PO daily, Lisinopril 10 mg daily PO, Atorvastatin 80 mg once daily, Aspirin 81 mg PO daily, Clopidogrel 75 mg daily.   Chronic problems CAD s/p Stent 2018 -Recent ultrasound showed EF of 30-35% that is worse than EF during her previous imaging.  -Continue aspirin, atorvastatin, lisinopril, furosemide, and Coreg, clopidogrel,   Nutrition Assessment: Regular diet, Heart-healthy diet   Outpatient Provider Follow Up Issues:  PCP referral Cardiology referral    Discharge Medications:   Your Medication List   STOP taking these medications  amLODIPine 10 MG tablet Commonly known as: NORVASC  START taking these medications  --amoxicillin-clavulanate 875-125 mg per tablet Commonly known as: AUGMENTIN Take 1 tablet by mouth Two (2) times a day. --furosemide 40 MG tablet Commonly known as: LASIX Take 1  tablet (40 mg total) by mouth daily. --lisinopril 10 MG tablet Commonly known as: PRINIVIL,ZESTRIL Take 1 tablet (10 mg total) by mouth daily.   CHANGE how you take these medications  --carvedilol 6.25 MG  tablet Commonly known as: COREG Take 1 tablet (6.25 mg total) by mouth Two (2) times a day. What changed:   medication strength  how much to take  how to take this  when to take this   CONTINUE taking these medications  --ascorbic acid (vitamin C) Crys Take 100 mg by mouth daily. aspirin 81 MG chewable tablet CHEW 1 TABLET DAILY --atorvastatin 80 MG tablet Commonly known as: LIPITOR TAKE 1 TABLET (80MG  TOTAL) BY MOUTH ONCE DAILY IN THE EVENING --cetirizine 10 MG tablet Commonly known as: ZyrTEC Take 10 mg by mouth daily as needed for allergies. --clopidogrel 75 mg tablet Commonly known as: PLAVIX Take 75 mg by mouth daily. --ergocalciferol (vitamin D2) 50 mcg (2,000 unit) Cap Take 1,250 mcg by mouth daily. --fluticasone propionate 50 mcg/actuation nasal spray Commonly known as: FLONASE SPRAY 2 SPRAYS IN EACH NOSTRIL DAILY   Lab Results  Component Value Date   CREATININE 0.85 10/08/2020   BUN 15 10/08/2020   NA 143 10/08/2020   K 5.1 10/08/2020   CL 104 10/08/2020   CO2 24 10/08/2020   Lab Results  Component Value Date   CHOL 176 10/08/2020   HDL 36 (L) 10/08/2020   LDLCALC 115 (H) 10/08/2020   TRIG 141 10/08/2020   CHOLHDL 4.9 (H) 10/08/2020   Lab Results  Component Value Date   TSH 1.730 10/08/2020   Lab Results  Component Value Date   HGBA1C 6.0 (H) 10/08/2020   Lab Results  Component Value Date   WBC 7.5 10/08/2020   HGB 12.7 10/08/2020   HCT 40.3 10/08/2020   MCV 89 10/08/2020   PLT 387 10/08/2020   Lab Results  Component Value Date   ALT 11 10/08/2020   AST 14 10/08/2020   ALKPHOS 123 (H) 10/08/2020   BILITOT 0.5 10/08/2020     Review of Systems  Constitutional:  Positive for fatigue. Negative for chills and fever.  Respiratory:  Negative for cough, chest tightness, shortness of breath and wheezing.   Cardiovascular:  Negative for chest pain and leg swelling.  Neurological:  Negative for dizziness and light-headedness.   Psychiatric/Behavioral:  Negative for dysphoric mood and sleep disturbance. The patient is not nervous/anxious.    Patient Active Problem List   Diagnosis Date Noted   Coronary artery disease of native artery of native heart with stable angina pectoris (HCC) 10/08/2020   Plantar wart 05/02/2018   Hypertriglyceridemia 05/01/2018   Pre-diabetes 05/01/2018   Systolic heart failure (HCC) 01/11/2017   History of non-ST elevation myocardial infarction (NSTEMI) 01/11/2017   Essential (primary) hypertension 08/14/2014   Allergic rhinitis, seasonal 08/14/2014    Allergies  Allergen Reactions   Codeine Nausea And Vomiting    Past Surgical History:  Procedure Laterality Date   CORONARY ANGIOPLASTY WITH STENT PLACEMENT  01/2017   DES to LAD   UTERINE FIBROID SURGERY  1996    Social History   Tobacco Use   Smoking status: Never   Smokeless tobacco: Never  Vaping Use   Vaping Use: Never used  Substance Use Topics   Alcohol use: Never    Alcohol/week: 0.0 standard drinks   Drug use: Never     Medication list has been reviewed and updated.  Current Meds  Medication Sig   Ascorbic Acid (  VITAMIN C PO) Take by mouth daily.   aspirin 81 MG chewable tablet Chew 81 mg by mouth daily.   atorvastatin (LIPITOR) 80 MG tablet Take 1 tablet (80 mg total) by mouth daily.   cetirizine (ZYRTEC) 10 MG tablet TAKE 1 TABLET BY MOUTH DAILY   Cyanocobalamin (VITAMIN B-12 PO) Take by mouth.   Ergocalciferol 50 MCG (2000 UT) CAPS Take 1 capsule by mouth daily.   fluticasone (FLONASE) 50 MCG/ACT nasal spray Place 2 sprays into both nostrils daily.   furosemide (LASIX) 40 MG tablet Take 40 mg by mouth daily.   lisinopril (ZESTRIL) 10 MG tablet Take 10 mg by mouth daily.   nitroGLYCERIN (NITROSTAT) 0.4 MG SL tablet Place 1 tablet (0.4 mg total) under the tongue every 5 (five) minutes as needed for chest pain.    PHQ 2/9 Scores 01/14/2021 10/08/2020 06/30/2020 05/12/2020  PHQ - 2 Score 0 0 0 0  PHQ- 9  Score 3 1 - 0    GAD 7 : Generalized Anxiety Score 01/14/2021 10/08/2020 05/12/2020 04/16/2020  Nervous, Anxious, on Edge 0 0 0 0  Control/stop worrying 0 0 0 0  Worry too much - different things 0 0 0 0  Trouble relaxing 0 0 0 0  Restless 0 0 0 0  Easily annoyed or irritable 0 0 0 0  Afraid - awful might happen 0 0 0 0  Total GAD 7 Score 0 0 0 0  Anxiety Difficulty Not difficult at all - Not difficult at all -    BP Readings from Last 3 Encounters:  01/14/21 136/64  10/08/20 130/72  06/30/20 122/82    Physical Exam Vitals and nursing note reviewed.  Constitutional:      General: She is not in acute distress.    Appearance: Normal appearance. She is well-developed.  HENT:     Head: Normocephalic and atraumatic.  Neck:     Vascular: No carotid bruit.  Cardiovascular:     Rate and Rhythm: Normal rate and regular rhythm.     Pulses: Normal pulses.     Heart sounds: No murmur heard. Pulmonary:     Effort: Pulmonary effort is normal. No respiratory distress.     Breath sounds: No wheezing or rhonchi.  Musculoskeletal:     Cervical back: Normal range of motion.     Right lower leg: No edema.     Left lower leg: No edema.  Lymphadenopathy:     Cervical: No cervical adenopathy.  Skin:    General: Skin is warm and dry.     Findings: No rash.  Neurological:     General: No focal deficit present.     Mental Status: She is alert and oriented to person, place, and time.  Psychiatric:        Mood and Affect: Mood normal.        Behavior: Behavior normal.    Wt Readings from Last 3 Encounters:  01/14/21 184 lb (83.5 kg)  10/08/20 184 lb (83.5 kg)  06/30/20 185 lb 6.4 oz (84.1 kg)    BP 136/64 (BP Location: Right Arm, Patient Position: Sitting, Cuff Size: Large)   Pulse 74   Ht 5\' 2"  (1.575 m)   Wt 184 lb (83.5 kg)   SpO2 94%   BMI 33.65 kg/m   Assessment and Plan: 1. Flash pulmonary edema (HCC) Now recovered from the acute event.  Gradually resume modest physical  activity around the home. Recommend remaining out of work until seen by Cardiology.  Letter written for her employer  2. Essential (primary) hypertension Now controlled with change in medication. Stressed low sodium diet, check electrolytes now that she is on daily lasix. - Basic metabolic panel   Partially dictated using Animal nutritionist. Any errors are unintentional.  Bari Edward, MD [facility]  01/14/2021

## 2021-01-15 LAB — BASIC METABOLIC PANEL
BUN/Creatinine Ratio: 16 (ref 12–28)
BUN: 13 mg/dL (ref 8–27)
CO2: 27 mmol/L (ref 20–29)
Calcium: 9.2 mg/dL (ref 8.7–10.3)
Chloride: 104 mmol/L (ref 96–106)
Creatinine, Ser: 0.8 mg/dL (ref 0.57–1.00)
Glucose: 85 mg/dL (ref 65–99)
Potassium: 4.5 mmol/L (ref 3.5–5.2)
Sodium: 143 mmol/L (ref 134–144)
eGFR: 81 mL/min/{1.73_m2} (ref >59)

## 2021-01-20 ENCOUNTER — Ambulatory Visit: Payer: Self-pay

## 2021-01-20 NOTE — Telephone Encounter (Signed)
Called and spoke with patient. Informed to try and take BP the next few days for once a day after resting. If it continues to remain elevated, told her to call the clinic and make an office visit to be seen.

## 2021-01-20 NOTE — Telephone Encounter (Signed)
Pt. Reports she checked her BP today - 160/92. No symptoms. "I feel fine." Also reports she has had a dry cough since she left the hospital. Spoke with Marylene Land in the practice and will send triage for review.

## 2021-01-20 NOTE — Telephone Encounter (Signed)
Reason for Disposition  Systolic BP  >= 180 OR Diastolic >= 110  Answer Assessment - Initial Assessment Questions 1. BLOOD PRESSURE: "What is the blood pressure?" "Did you take at least two measurements 5 minutes apart?"     160/92 2. ONSET: "When did you take your blood pressure?"     Today 3. HOW: "How did you obtain the blood pressure?" (e.g., visiting nurse, automatic home BP monitor)     Home cuff 4. HISTORY: "Do you have a history of high blood pressure?"     Yes 5. MEDICATIONS: "Are you taking any medications for blood pressure?" "Have you missed any doses recently?"     No 6. OTHER SYMPTOMS: "Do you have any symptoms?" (e.g., headache, chest pain, blurred vision, difficulty breathing, weakness)     No 7. PREGNANCY: "Is there any chance you are pregnant?" "When was your last menstrual period?"     No  Protocols used: Blood Pressure - High-A-AH

## 2021-01-21 ENCOUNTER — Ambulatory Visit: Payer: Self-pay | Admitting: *Deleted

## 2021-01-21 NOTE — Telephone Encounter (Signed)
Per agent: "Pt calling in again with concerns regarding her BP. She states that she was able to take it last night and it was 170/90. She states that she was coughing and having some sweats. Pt states that she took aleeve and fell asleep and that she is feeling much better now with BP 141/70. She is requesting to have nurse follow up to give advice. Please advise."   Pt reports BP last night 170/90. States at 2100 took a NTG "Just to get my BP down." States at 2200 took an Aleve. Reports this AM 141/70. States "I feel like a cold coming on that is affecting my blood pressure."  Also reports seasonal allergies "Took my allergy pill yesterday." Reports chilss, sweating last night, cough, none presently. Advised Covid testing, pt states may do so. Assured pt NT would route information to practice for PCPs review. Pt verbalizes understanding. Reason for Disposition  [1] Systolic BP  >= 130 OR Diastolic >= 80 AND [2] taking BP medications  Answer Assessment - Initial Assessment Questions 1. BLOOD PRESSURE: "What is the blood pressure?" "Did you take at least two measurements 5 minutes apart?"     170/90 and this AM 141/70 2. ONSET: "When did you take your blood pressure?"     *No Answer* 3. HOW: "How did you obtain the blood pressure?" (e.g., visiting nurse, automatic home BP monitor)     *No Answer*4. HISTORY: "Do you have a history of high blood pressure?"     Yes 5. MEDICATIONS: "Are you taking any medications for blood pressure?" "Have you missed any doses recently?"     No missed doses 6. OTHER SYMPTOMS: "Do you have any symptoms?" (e.g., headache, chest pain, blurred vision, difficulty breathing, weakness)     Cough, sweats  Protocols used: Blood Pressure - High-A-AH

## 2021-01-22 DIAGNOSIS — Z20822 Contact with and (suspected) exposure to covid-19: Secondary | ICD-10-CM | POA: Diagnosis not present

## 2021-01-23 ENCOUNTER — Other Ambulatory Visit: Payer: Self-pay

## 2021-01-23 ENCOUNTER — Encounter: Payer: Self-pay | Admitting: Internal Medicine

## 2021-01-23 ENCOUNTER — Ambulatory Visit (INDEPENDENT_AMBULATORY_CARE_PROVIDER_SITE_OTHER): Payer: Medicare Other | Admitting: Internal Medicine

## 2021-01-23 VITALS — BP 142/84 | HR 75 | Temp 98.2°F | Ht 62.0 in | Wt 184.0 lb

## 2021-01-23 DIAGNOSIS — I10 Essential (primary) hypertension: Secondary | ICD-10-CM | POA: Diagnosis not present

## 2021-01-23 DIAGNOSIS — J069 Acute upper respiratory infection, unspecified: Secondary | ICD-10-CM | POA: Diagnosis not present

## 2021-01-23 MED ORDER — DOXYCYCLINE HYCLATE 100 MG PO TABS: 100.0000 mg | ORAL_TABLET | Freq: Two times a day (BID) | ORAL | 0 refills | Status: AC

## 2021-01-23 NOTE — Progress Notes (Signed)
Date:  01/23/2021   Name:  Cassidy Dawson   DOB:  12-Aug-1955   MRN:  [id number]   Chief Complaint: Hypertension  Hypertension This is a chronic problem. The problem has been waxing and waning since onset. The problem is resistant. Pertinent negatives include no chest pain, headaches, palpitations, shortness of breath or sweats. Past treatments include diuretics, beta blockers and ACE inhibitors. The current treatment provides mild improvement. There are no compliance problems.  Hypertensive end-organ damage includes CAD/MI (flash Pulmonary edema recently).  Cough This is a new problem. The current episode started in the past 7 days. The problem has been unchanged. The problem occurs every few minutes. The cough is Non-productive. Associated symptoms include a fever (subjective fever) and wheezing. Pertinent negatives include no chest pain, chills, headaches, nasal congestion, sore throat, shortness of breath or sweats. The symptoms are aggravated by lying down. Treatments tried: treated for pneumonia during hospital stay.   Lab Results  Component Value Date   CREATININE 0.80 01/14/2021   BUN 13 01/14/2021   NA 143 01/14/2021   K 4.5 01/14/2021   CL 104 01/14/2021   CO2 27 01/14/2021   Lab Results  Component Value Date   CHOL 176 10/08/2020   HDL 36 (L) 10/08/2020   LDLCALC 115 (H) 10/08/2020   TRIG 141 10/08/2020   CHOLHDL 4.9 (H) 10/08/2020   Lab Results  Component Value Date   TSH 1.730 10/08/2020   Lab Results  Component Value Date   HGBA1C 6.0 (H) 10/08/2020   Lab Results  Component Value Date   WBC 7.5 10/08/2020   HGB 12.7 10/08/2020   HCT 40.3 10/08/2020   MCV 89 10/08/2020   PLT 387 10/08/2020   Lab Results  Component Value Date   ALT 11 10/08/2020   AST 14 10/08/2020   ALKPHOS 123 (H) 10/08/2020   BILITOT 0.5 10/08/2020     Review of Systems  Constitutional:  Positive for fever (subjective fever). Negative for chills.  HENT:  Negative for  sore throat and trouble swallowing.   Respiratory:  Positive for cough and wheezing. Negative for chest tightness and shortness of breath.   Cardiovascular:  Negative for chest pain, palpitations and leg swelling.  Neurological:  Negative for dizziness, light-headedness and headaches.  Psychiatric/Behavioral:  Negative for dysphoric mood and sleep disturbance. The patient is not nervous/anxious.    Patient Active Problem List   Diagnosis Date Noted   Coronary artery disease of native artery of native heart with stable angina pectoris (HCC) 10/08/2020   Plantar wart 05/02/2018   Hypertriglyceridemia 05/01/2018   Pre-diabetes 05/01/2018   Systolic heart failure (HCC) 01/11/2017   History of non-ST elevation myocardial infarction (NSTEMI) 01/11/2017   Essential (primary) hypertension 08/14/2014   Allergic rhinitis, seasonal 08/14/2014    Allergies  Allergen Reactions   Codeine Nausea And Vomiting    Past Surgical History:  Procedure Laterality Date   CORONARY ANGIOPLASTY WITH STENT PLACEMENT  01/2017   DES to LAD   UTERINE FIBROID SURGERY  1996    Social History   Tobacco Use   Smoking status: Never   Smokeless tobacco: Never  Vaping Use   Vaping Use: Never used  Substance Use Topics   Alcohol use: Never    Alcohol/week: 0.0 standard drinks   Drug use: Never     Medication list has been reviewed and updated.  Current Meds  Medication Sig   Ascorbic Acid (VITAMIN C PO) Take by mouth daily.  aspirin 81 MG chewable tablet Chew 81 mg by mouth daily.   atorvastatin (LIPITOR) 80 MG tablet Take 1 tablet (80 mg total) by mouth daily.   carvedilol (COREG) 6.25 MG tablet Take 6.25 mg by mouth 2 (two) times daily with a meal.   cetirizine (ZYRTEC) 10 MG tablet TAKE 1 TABLET BY MOUTH DAILY   Cyanocobalamin (VITAMIN B-12 PO) Take by mouth.   Ergocalciferol 50 MCG (2000 UT) CAPS Take 1 capsule by mouth daily.   fluticasone (FLONASE) 50 MCG/ACT nasal spray Place 2 sprays into  both nostrils daily.   furosemide (LASIX) 40 MG tablet Take 40 mg by mouth daily.   lisinopril (ZESTRIL) 10 MG tablet Take 10 mg by mouth daily.   nitroGLYCERIN (NITROSTAT) 0.4 MG SL tablet Place 1 tablet (0.4 mg total) under the tongue every 5 (five) minutes as needed for chest pain.    PHQ 2/9 Scores 01/23/2021 01/14/2021 10/08/2020 06/30/2020  PHQ - 2 Score 0 0 0 0  PHQ- 9 Score 0 3 1 -    GAD 7 : Generalized Anxiety Score 01/23/2021 01/14/2021 10/08/2020 05/12/2020  Nervous, Anxious, on Edge 0 0 0 0  Control/stop worrying 0 0 0 0  Worry too much - different things 0 0 0 0  Trouble relaxing 0 0 0 0  Restless 0 0 0 0  Easily annoyed or irritable 0 0 0 0  Afraid - awful might happen 0 0 0 0  Total GAD 7 Score 0 0 0 0  Anxiety Difficulty Not difficult at all Not difficult at all - Not difficult at all    BP Readings from Last 3 Encounters:  01/23/21 (!) 142/84  01/14/21 124/72  10/08/20 130/72    Physical Exam Vitals and nursing note reviewed.  Constitutional:      General: She is not in acute distress.    Appearance: Normal appearance. She is well-developed.  HENT:     Head: Normocephalic and atraumatic.  Neck:     Vascular: No carotid bruit.  Cardiovascular:     Rate and Rhythm: Normal rate and regular rhythm.     Pulses: Normal pulses.     Heart sounds: No murmur heard. Pulmonary:     Effort: Pulmonary effort is normal. No respiratory distress.     Breath sounds: Normal air entry. No decreased air movement. Examination of the right-lower field reveals wheezing. Wheezing present. No rhonchi.  Musculoskeletal:     Cervical back: Normal range of motion.  Lymphadenopathy:     Cervical: No cervical adenopathy.  Skin:    General: Skin is warm and dry.     Findings: No rash.  Neurological:     Mental Status: She is alert and oriented to person, place, and time.  Psychiatric:        Mood and Affect: Mood normal.        Behavior: Behavior normal.    Wt Readings from Last 3  Encounters:  01/23/21 184 lb (83.5 kg)  01/14/21 184 lb (83.5 kg)  10/08/20 184 lb (83.5 kg)    BP (!) 142/84   Pulse 75   Temp 98.2 F (36.8 C) (Oral)   Ht 5\' 2"  (1.575 m)   Wt 184 lb (83.5 kg)   SpO2 95%   BMI 33.65 kg/m   Assessment and Plan: 1. Essential (primary) hypertension BP control is only fair on three agents. Recommend adding a second dose of Lisinopril 10 mg. Review with Cardiology next week  2. Upper respiratory tract  infection, unspecified type Subjective fever normal today; O2 sat good.  No red flag s/s. Will treat empirically with Doxycycline - if worsening will need CXR/CBC etc - doxycycline (VIBRA-TABS) 100 MG tablet; Take 1 tablet (100 mg total) by mouth 2 (two) times daily for 10 days.  Dispense: 20 tablet; Refill: 0   Partially dictated using Animal nutritionist. Any errors are unintentional.  Bari Edward, MD [facility]  01/23/2021

## 2021-01-23 NOTE — Patient Instructions (Addendum)
Add a second lisinopril 10 mg in the evening.  Bring your BP cuff to the next appt.  Take the Doxycycline for your cough.  If you get acutely worse, go to the ER or urgent care.

## 2021-01-27 DIAGNOSIS — J810 Acute pulmonary edema: Secondary | ICD-10-CM | POA: Diagnosis not present

## 2021-01-27 DIAGNOSIS — I214 Non-ST elevation (NSTEMI) myocardial infarction: Secondary | ICD-10-CM | POA: Diagnosis not present

## 2021-01-27 DIAGNOSIS — I2510 Atherosclerotic heart disease of native coronary artery without angina pectoris: Secondary | ICD-10-CM | POA: Diagnosis not present

## 2021-02-03 DIAGNOSIS — J810 Acute pulmonary edema: Secondary | ICD-10-CM | POA: Diagnosis not present

## 2021-02-03 DIAGNOSIS — I214 Non-ST elevation (NSTEMI) myocardial infarction: Secondary | ICD-10-CM | POA: Diagnosis not present

## 2021-02-03 DIAGNOSIS — I2510 Atherosclerotic heart disease of native coronary artery without angina pectoris: Secondary | ICD-10-CM | POA: Diagnosis not present

## 2021-02-11 ENCOUNTER — Telehealth: Payer: Self-pay

## 2021-02-11 ENCOUNTER — Other Ambulatory Visit: Payer: Self-pay

## 2021-02-11 ENCOUNTER — Encounter: Payer: Self-pay | Admitting: Internal Medicine

## 2021-02-11 ENCOUNTER — Ambulatory Visit (INDEPENDENT_AMBULATORY_CARE_PROVIDER_SITE_OTHER): Payer: Medicare Other | Admitting: Internal Medicine

## 2021-02-11 VITALS — BP 140/90 | HR 84 | Ht 62.0 in | Wt 187.0 lb

## 2021-02-11 DIAGNOSIS — K5901 Slow transit constipation: Secondary | ICD-10-CM | POA: Diagnosis not present

## 2021-02-11 DIAGNOSIS — I10 Essential (primary) hypertension: Secondary | ICD-10-CM

## 2021-02-11 DIAGNOSIS — R058 Other specified cough: Secondary | ICD-10-CM | POA: Diagnosis not present

## 2021-02-11 MED ORDER — LOSARTAN POTASSIUM 100 MG PO TABS
100.0000 mg | ORAL_TABLET | Freq: Every day | ORAL | 1 refills | Status: DC
Start: 2021-02-11 — End: 2021-07-21

## 2021-02-11 NOTE — Telephone Encounter (Signed)
Copied from CRM [id number]. Topic: General - Other >> Feb 11, 2021  9:57 AM Jaquita Rector A wrote: Reason for CRM: Patient called in to inquire of Dr Judithann Graves if she can be prescribed an inhaler say that she is asking because she hear herself wheezing when laying down or just resting. Per patient she feel fine no SOB or other breathing issues just the wheezing from time to time. Also states that she does have a dry cough from time to time and this was already pointed out by the pharmacist that one of the medication may cause a dry cough. Please advise and call Ph# [phone]

## 2021-02-11 NOTE — Progress Notes (Signed)
Date:  02/11/2021   Name:  Cassidy Dawson   DOB:  January 16, 1956   MRN:  [id number]   Chief Complaint: Wheezing  Wheezing  This is a new problem. The current episode started in the past 7 days. Episode frequency: just one episode after lying down. Associated symptoms include coughing. Pertinent negatives include no abdominal pain, chest pain, chills, fever, headaches, shortness of breath or sore throat. The symptoms are aggravated by lying flat. Treatments tried: claritin. The treatment provided mild relief.  Cough This is a new problem. The problem has been unchanged. The problem occurs every few hours. The cough is Non-productive. Associated symptoms include postnasal drip and wheezing. Pertinent negatives include no chest pain, chills, fever, headaches, sore throat or shortness of breath. Treatments tried: claritin. Her past medical history is significant for environmental allergies.  Constipation This is a new problem. The problem is unchanged. The stool is described as pellet like and firm. The patient is not on a high fiber diet. She Does not exercise regularly. There has Been adequate water intake. Pertinent negatives include no abdominal pain or fever.   Lab Results  Component Value Date   CREATININE 0.80 01/14/2021   BUN 13 01/14/2021   NA 143 01/14/2021   K 4.5 01/14/2021   CL 104 01/14/2021   CO2 27 01/14/2021   Lab Results  Component Value Date   CHOL 176 10/08/2020   HDL 36 (L) 10/08/2020   LDLCALC 115 (H) 10/08/2020   TRIG 141 10/08/2020   CHOLHDL 4.9 (H) 10/08/2020   Lab Results  Component Value Date   TSH 1.730 10/08/2020   Lab Results  Component Value Date   HGBA1C 6.0 (H) 10/08/2020   Lab Results  Component Value Date   WBC 7.5 10/08/2020   HGB 12.7 10/08/2020   HCT 40.3 10/08/2020   MCV 89 10/08/2020   PLT 387 10/08/2020   Lab Results  Component Value Date   ALT 11 10/08/2020   AST 14 10/08/2020   ALKPHOS 123 (H) 10/08/2020   BILITOT 0.5  10/08/2020     Review of Systems  Constitutional:  Negative for chills, fatigue, fever and unexpected weight change.  HENT:  Positive for postnasal drip. Negative for congestion, sinus pressure and sore throat.   Respiratory:  Positive for cough and wheezing. Negative for shortness of breath.   Cardiovascular:  Negative for chest pain and leg swelling.  Gastrointestinal:  Positive for constipation. Negative for abdominal pain and blood in stool.  Allergic/Immunologic: Positive for environmental allergies.  Neurological:  Negative for dizziness, light-headedness and headaches.  Psychiatric/Behavioral:  Negative for dysphoric mood. The patient is not nervous/anxious.    Patient Active Problem List   Diagnosis Date Noted   Coronary artery disease of native artery of native heart with stable angina pectoris (HCC) 10/08/2020   Plantar wart 05/02/2018   Hypertriglyceridemia 05/01/2018   Pre-diabetes 05/01/2018   Systolic heart failure (HCC) 01/11/2017   History of non-ST elevation myocardial infarction (NSTEMI) 01/11/2017   Essential (primary) hypertension 08/14/2014   Allergic rhinitis, seasonal 08/14/2014    Allergies  Allergen Reactions   Codeine Nausea And Vomiting    Past Surgical History:  Procedure Laterality Date   CORONARY ANGIOPLASTY WITH STENT PLACEMENT  01/2017   DES to LAD   UTERINE FIBROID SURGERY  1996    Social History   Tobacco Use   Smoking status: Never   Smokeless tobacco: Never  Vaping Use   Vaping Use: Never used  Substance Use Topics   Alcohol use: Never    Alcohol/week: 0.0 standard drinks   Drug use: Never     Medication list has been reviewed and updated.  Current Meds  Medication Sig   Ascorbic Acid (VITAMIN C PO) Take by mouth daily.   aspirin 81 MG chewable tablet Chew 81 mg by mouth daily.   atorvastatin (LIPITOR) 80 MG tablet Take 1 tablet (80 mg total) by mouth daily.   carvedilol (COREG) 6.25 MG tablet Take 6.25 mg by mouth 2 (two)  times daily with a meal.   cetirizine (ZYRTEC) 10 MG tablet TAKE 1 TABLET BY MOUTH DAILY   Cyanocobalamin (VITAMIN B-12 PO) Take by mouth.   Ergocalciferol 50 MCG (2000 UT) CAPS Take 1 capsule by mouth daily.   fluticasone (FLONASE) 50 MCG/ACT nasal spray Place 2 sprays into both nostrils daily.   furosemide (LASIX) 40 MG tablet Take 40 mg by mouth daily.   losartan (COZAAR) 100 MG tablet Take 1 tablet (100 mg total) by mouth daily.   nitroGLYCERIN (NITROSTAT) 0.4 MG SL tablet Place 1 tablet (0.4 mg total) under the tongue every 5 (five) minutes as needed for chest pain.   [DISCONTINUED] lisinopril (ZESTRIL) 10 MG tablet Take 10 mg by mouth daily.    PHQ 2/9 Scores 02/11/2021 01/23/2021 01/14/2021 10/08/2020  PHQ - 2 Score 0 0 0 0  PHQ- 9 Score 0 0 3 1    GAD 7 : Generalized Anxiety Score 02/11/2021 01/23/2021 01/14/2021 10/08/2020  Nervous, Anxious, on Edge 0 0 0 0  Control/stop worrying 0 0 0 0  Worry too much - different things 0 0 0 0  Trouble relaxing 0 0 0 0  Restless 0 0 0 0  Easily annoyed or irritable 0 0 0 0  Afraid - awful might happen 0 0 0 0  Total GAD 7 Score 0 0 0 0  Anxiety Difficulty - Not difficult at all Not difficult at all -    BP Readings from Last 3 Encounters:  02/11/21 140/90  01/23/21 (!) 142/84  01/14/21 124/72    Physical Exam Constitutional:      Appearance: Normal appearance.  Neck:     Vascular: No carotid bruit or JVD.  Cardiovascular:     Rate and Rhythm: Normal rate and regular rhythm.     Pulses: Normal pulses.  Pulmonary:     Effort: Pulmonary effort is normal.     Breath sounds: Normal breath sounds. No wheezing or rhonchi.  Abdominal:     General: There is no distension.     Palpations: Abdomen is soft.     Tenderness: There is no abdominal tenderness.  Musculoskeletal:     Cervical back: Normal range of motion.     Right lower leg: No edema.     Left lower leg: No edema.  Lymphadenopathy:     Cervical: No cervical adenopathy.   Skin:    General: Skin is warm and dry.  Neurological:     Mental Status: She is alert.    Wt Readings from Last 3 Encounters:  02/11/21 187 lb (84.8 kg)  01/23/21 184 lb (83.5 kg)  01/14/21 184 lb (83.5 kg)    BP 140/90   Pulse 84   Ht 5\' 2"  (1.575 m)   Wt 187 lb (84.8 kg)   SpO2 97%   BMI 34.20 kg/m   Assessment and Plan: 1. Essential (primary) hypertension Stop lisinopril and begin losartan 100 mg. Monitor BP at home. -  losartan (COZAAR) 100 MG tablet; Take 1 tablet (100 mg total) by mouth daily.  Dispense: 90 tablet; Refill: 1  2. Cough due to ACE inhibitor D/c lisinopril. No wheezing noted on exam today - only one episode per patient I would avoid albuterol MDI due to increase in HR and BP - she can follow up if this become chronic.  3. Slow transit constipation Recommend glycerin suppositories and/or Miralax powder.   Partially dictated using Animal nutritionist. Any errors are unintentional.  Bari Edward, MD [facility]  02/11/2021

## 2021-02-11 NOTE — Patient Instructions (Signed)
Miralax (glycolax) powder - used as directed then adjust dose to 1-2 stools per day  Glycerin suppositories.

## 2021-04-09 ENCOUNTER — Encounter: Payer: Self-pay | Admitting: Internal Medicine

## 2021-04-09 ENCOUNTER — Other Ambulatory Visit: Payer: Self-pay

## 2021-04-09 ENCOUNTER — Ambulatory Visit (INDEPENDENT_AMBULATORY_CARE_PROVIDER_SITE_OTHER): Payer: Medicare Other | Admitting: Internal Medicine

## 2021-04-09 VITALS — BP 139/76 | HR 80 | Ht 62.0 in | Wt 178.0 lb

## 2021-04-09 DIAGNOSIS — R7303 Prediabetes: Secondary | ICD-10-CM

## 2021-04-09 DIAGNOSIS — J3089 Other allergic rhinitis: Secondary | ICD-10-CM | POA: Diagnosis not present

## 2021-04-09 DIAGNOSIS — I5020 Unspecified systolic (congestive) heart failure: Secondary | ICD-10-CM | POA: Diagnosis not present

## 2021-04-09 DIAGNOSIS — I10 Essential (primary) hypertension: Secondary | ICD-10-CM

## 2021-04-09 MED ORDER — FLUTICASONE PROPIONATE 50 MCG/ACT NA SUSP: 2.0000 | Freq: Every day | NASAL | 1 refills | Status: AC

## 2021-04-09 MED ORDER — CETIRIZINE HCL 10 MG PO TABS: 10.0000 mg | ORAL_TABLET | Freq: Every day | ORAL | 1 refills | Status: DC

## 2021-04-09 NOTE — Progress Notes (Signed)
Date:  04/09/2021   Name:  Cassidy Dawson   DOB:  03-07-1956   MRN:  [id number]   Chief Complaint: Prediabetes, Hypertension, and Allergies (Refill Flonase.)  Hypertension This is a chronic problem. Pertinent negatives include no chest pain, headaches, palpitations or shortness of breath. Past treatments include angiotensin blockers, beta blockers and diuretics. Hypertensive end-organ damage includes CAD/MI and heart failure.  Diabetes She presents for her follow-up diabetic visit. Diabetes type: prediabetes. Her disease course has been stable. Pertinent negatives for hypoglycemia include no dizziness, headaches or nervousness/anxiousness. Pertinent negatives for diabetes include no chest pain, no fatigue and no weakness. Current diabetic treatment includes diet.   Lab Results  Component Value Date   NA 143 01/14/2021   K 4.5 01/14/2021   CO2 27 01/14/2021   GLUCOSE 85 01/14/2021   BUN 13 01/14/2021   CREATININE 0.80 01/14/2021   CALCIUM 9.2 01/14/2021   EGFR 81 01/14/2021   GFRNONAA 78 10/04/2019   Lab Results  Component Value Date   CHOL 176 10/08/2020   HDL 36 (L) 10/08/2020   LDLCALC 115 (H) 10/08/2020   TRIG 141 10/08/2020   CHOLHDL 4.9 (H) 10/08/2020   Lab Results  Component Value Date   TSH 1.730 10/08/2020   Lab Results  Component Value Date   HGBA1C 6.0 (H) 10/08/2020   Lab Results  Component Value Date   WBC 7.5 10/08/2020   HGB 12.7 10/08/2020   HCT 40.3 10/08/2020   MCV 89 10/08/2020   PLT 387 10/08/2020   Lab Results  Component Value Date   ALT 11 10/08/2020   AST 14 10/08/2020   ALKPHOS 123 (H) 10/08/2020   BILITOT 0.5 10/08/2020   No results found for: 25OHVITD2, 25OHVITD3, VD25OH   Review of Systems  Constitutional:  Negative for fatigue and unexpected weight change.  HENT:  Negative for nosebleeds.   Eyes:  Negative for visual disturbance.  Respiratory:  Negative for cough, chest tightness, shortness of breath and wheezing.    Cardiovascular:  Negative for chest pain, palpitations and leg swelling.  Gastrointestinal:  Negative for abdominal pain, constipation and diarrhea.  Allergic/Immunologic: Positive for environmental allergies.  Neurological:  Negative for dizziness, weakness, light-headedness and headaches.  Psychiatric/Behavioral:  Negative for dysphoric mood and sleep disturbance. The patient is not nervous/anxious.    Patient Active Problem List   Diagnosis Date Noted   Slow transit constipation 02/11/2021   Coronary artery disease of native artery of native heart with stable angina pectoris (HCC) 10/08/2020   Plantar wart 05/02/2018   Hypertriglyceridemia 05/01/2018   Pre-diabetes 05/01/2018   Systolic heart failure (HCC) 01/11/2017   History of non-ST elevation myocardial infarction (NSTEMI) 01/11/2017   Essential (primary) hypertension 08/14/2014   Allergic rhinitis, seasonal 08/14/2014    Allergies  Allergen Reactions   Ace Inhibitors Cough   Codeine Nausea And Vomiting    Past Surgical History:  Procedure Laterality Date   CORONARY ANGIOPLASTY WITH STENT PLACEMENT  01/2017   DES to LAD   UTERINE FIBROID SURGERY  1996    Social History   Tobacco Use   Smoking status: Never   Smokeless tobacco: Never  Vaping Use   Vaping Use: Never used  Substance Use Topics   Alcohol use: Never    Alcohol/week: 0.0 standard drinks   Drug use: Never     Medication list has been reviewed and updated.  Current Meds  Medication Sig   Ascorbic Acid (VITAMIN C PO) Take by mouth daily.  aspirin 81 MG chewable tablet Chew 81 mg by mouth daily.   atorvastatin (LIPITOR) 80 MG tablet Take 1 tablet (80 mg total) by mouth daily.   carvedilol (COREG) 6.25 MG tablet Take 6.25 mg by mouth 2 (two) times daily with a meal.   Cyanocobalamin (VITAMIN B-12 PO) Take by mouth.   Ergocalciferol 50 MCG (2000 UT) CAPS Take 1 capsule by mouth daily.   furosemide (LASIX) 40 MG tablet Take 40 mg by mouth daily.    losartan (COZAAR) 100 MG tablet Take 1 tablet (100 mg total) by mouth daily.   nitroGLYCERIN (NITROSTAT) 0.4 MG SL tablet Place 1 tablet (0.4 mg total) under the tongue every 5 (five) minutes as needed for chest pain.   [DISCONTINUED] cetirizine (ZYRTEC) 10 MG tablet TAKE 1 TABLET BY MOUTH DAILY   [DISCONTINUED] fluticasone (FLONASE) 50 MCG/ACT nasal spray Place 2 sprays into both nostrils daily.    PHQ 2/9 Scores 04/09/2021 02/11/2021 01/23/2021 01/14/2021  PHQ - 2 Score 0 0 0 0  PHQ- 9 Score 0 0 0 3    GAD 7 : Generalized Anxiety Score 04/09/2021 02/11/2021 01/23/2021 01/14/2021  Nervous, Anxious, on Edge 0 0 0 0  Control/stop worrying 0 0 0 0  Worry too much - different things 0 0 0 0  Trouble relaxing 0 0 0 0  Restless 0 0 0 0  Easily annoyed or irritable 0 0 0 0  Afraid - awful might happen 0 0 0 0  Total GAD 7 Score 0 0 0 0  Anxiety Difficulty Not difficult at all - Not difficult at all Not difficult at all    BP Readings from Last 3 Encounters:  04/09/21 139/76  02/11/21 140/90  01/23/21 (!) 142/84    Physical Exam Vitals and nursing note reviewed.  Constitutional:      General: She is not in acute distress.    Appearance: She is well-developed.  HENT:     Head: Normocephalic and atraumatic.  Cardiovascular:     Rate and Rhythm: Normal rate and regular rhythm.  Pulmonary:     Effort: Pulmonary effort is normal. No respiratory distress.     Breath sounds: No wheezing or rhonchi.  Musculoskeletal:     Cervical back: Normal range of motion.     Right lower leg: No edema.     Left lower leg: No edema.  Skin:    General: Skin is warm and dry.     Capillary Refill: Capillary refill takes less than 2 seconds.     Findings: No rash.  Neurological:     Mental Status: She is alert and oriented to person, place, and time.  Psychiatric:        Mood and Affect: Mood normal.        Behavior: Behavior normal.    Wt Readings from Last 3 Encounters:  04/09/21 178 lb (80.7  kg)  02/11/21 187 lb (84.8 kg)  01/23/21 184 lb (83.5 kg)    BP 139/76   Pulse 80   Ht $R'5\' 2"'Hc$  (1.575 m)   Wt 178 lb (80.7 kg)   SpO2 97%   BMI 32.56 kg/m   Assessment and Plan: 1. Essential (primary) hypertension Clinically stable exam with well controlled BP. Tolerating medications without side effects at this time. Pt to continue current regimen and low sodium diet; benefits of regular exercise as able discussed.  2. Systolic heart failure, unspecified HF chronicity ([place]) Has ECHO and Cardiology follow up next week at [facility]  current medications for now - cardiology can adjust as indicated  3. Pre-diabetes She has lost 9 lbs with increase in activity with healthy diet Will hold off on repeat A1C today.  4. Environmental and seasonal allergies PND and dry cough improved after stopping lisinopril but still occur intermittently - fluticasone (FLONASE) 50 MCG/ACT nasal spray; Place 2 sprays into both nostrils daily.  Dispense: 48 g; Refill: 1 - cetirizine (ZYRTEC) 10 MG tablet; Take 1 tablet (10 mg total) by mouth daily.  Dispense: 90 tablet; Refill: 1   Partially dictated using Editor, commissioning. Any errors are unintentional.  Halina Maidens, MD [facility]  04/09/2021

## 2021-06-04 DIAGNOSIS — I10 Essential (primary) hypertension: Secondary | ICD-10-CM | POA: Diagnosis not present

## 2021-06-04 DIAGNOSIS — I214 Non-ST elevation (NSTEMI) myocardial infarction: Secondary | ICD-10-CM | POA: Diagnosis not present

## 2021-06-04 DIAGNOSIS — Z7982 Long term (current) use of aspirin: Secondary | ICD-10-CM | POA: Diagnosis not present

## 2021-06-04 DIAGNOSIS — Z79899 Other long term (current) drug therapy: Secondary | ICD-10-CM | POA: Diagnosis not present

## 2021-06-04 DIAGNOSIS — E785 Hyperlipidemia, unspecified: Secondary | ICD-10-CM | POA: Diagnosis not present

## 2021-06-04 DIAGNOSIS — I2510 Atherosclerotic heart disease of native coronary artery without angina pectoris: Secondary | ICD-10-CM | POA: Diagnosis not present

## 2021-06-04 DIAGNOSIS — E782 Mixed hyperlipidemia: Secondary | ICD-10-CM | POA: Diagnosis not present

## 2021-06-16 ENCOUNTER — Ambulatory Visit: Payer: Self-pay

## 2021-06-16 ENCOUNTER — Telehealth: Payer: Medicare Other | Admitting: Internal Medicine

## 2021-06-16 NOTE — Telephone Encounter (Signed)
°  Chief Complaint: URI symptoms Symptoms: sneezing, stuffy nose, body aches, fatigued, SOB Frequency: yesterday Pertinent Negatives: NA Disposition: [] ED /[] Urgent Care (no appt availability in office) / [x] Appointment(In office/virtual)/ []  [place] [facility]/ [] Home Care/ [] Refused Recommended Disposition /[] [place] [facility]/ []  Follow-up with PCP Additional Notes: Pt had took one old Augmentin from 1 year ago. She states it made her feel better but would need more to help with symptoms.   Summary: questions and concerns about taking an antibiotic from last year   Pt stated she had 2 pills of an antibiotic remaining from a Rx from last year. Pt stated she was feeling so bad that she took one of the pills. Pt requests call back to discuss. Cb# [phone]        Reason for Disposition  Prescription request for new medicine (not a refill)  Answer Assessment - Initial Assessment Questions 1. NAME of MEDICATION: "What medicine are you calling about?"     Augmentin 875-125 2. QUESTION: "What is your question?" (e.g., double dose of medicine, side effect)     Wanted to make sure she would be ok.  3. PRESCRIBING HCP: "Who prescribed it?" Reason: if prescribed by specialist, call should be referred to that group.     From hospital  4. SYMPTOMS: "Do you have any symptoms?"     Sneezing, stuffy nose, body aches, fatigued 5. SEVERITY: If symptoms are present, ask "Are they mild, moderate or severe?"     Mild  Protocols used: Medication Question Call-A-AH

## 2021-06-17 ENCOUNTER — Other Ambulatory Visit: Payer: Self-pay

## 2021-06-17 ENCOUNTER — Encounter: Payer: Self-pay | Admitting: Internal Medicine

## 2021-06-17 ENCOUNTER — Ambulatory Visit (INDEPENDENT_AMBULATORY_CARE_PROVIDER_SITE_OTHER): Payer: Medicare Other | Admitting: Internal Medicine

## 2021-06-17 VITALS — BP 142/88 | HR 62 | Temp 98.6°F | Ht 62.0 in | Wt 177.6 lb

## 2021-06-17 DIAGNOSIS — H6690 Otitis media, unspecified, unspecified ear: Secondary | ICD-10-CM | POA: Diagnosis not present

## 2021-06-17 MED ORDER — AMOXICILLIN-POT CLAVULANATE 875-125 MG PO TABS: 1.0000 | ORAL_TABLET | Freq: Two times a day (BID) | ORAL | 0 refills | Status: AC

## 2021-06-17 NOTE — Patient Instructions (Signed)
Continue flonase spray and Zyrtec  Increase fluids and take tylenol or Aleve for headache/body aches.

## 2021-06-17 NOTE — Progress Notes (Signed)
Date:  06/17/2021   Name:  Cassidy Dawson   DOB:  05-06-1955   MRN:  [id number]   Chief Complaint: Sinusitis  Sinus Problem This is a new problem. The current episode started in the past 7 days (4-5 days). The problem has been waxing and waning since onset. There has been no fever. Associated symptoms include coughing (No production.), shortness of breath and sneezing. Pertinent negatives include no headaches or sinus pressure. (Ears and throat itching, Rhinorrhea.) Negative for Covid yesterday with [facility].   Lab Results  Component Value Date   NA 143 01/14/2021   K 4.5 01/14/2021   CO2 27 01/14/2021   GLUCOSE 85 01/14/2021   BUN 13 01/14/2021   CREATININE 0.80 01/14/2021   CALCIUM 9.2 01/14/2021   EGFR 81 01/14/2021   GFRNONAA 78 10/04/2019   Lab Results  Component Value Date   CHOL 176 10/08/2020   HDL 36 (L) 10/08/2020   LDLCALC 115 (H) 10/08/2020   TRIG 141 10/08/2020   CHOLHDL 4.9 (H) 10/08/2020   Lab Results  Component Value Date   TSH 1.730 10/08/2020   Lab Results  Component Value Date   HGBA1C 6.0 (H) 10/08/2020   Lab Results  Component Value Date   WBC 7.5 10/08/2020   HGB 12.7 10/08/2020   HCT 40.3 10/08/2020   MCV 89 10/08/2020   PLT 387 10/08/2020   Lab Results  Component Value Date   ALT 11 10/08/2020   AST 14 10/08/2020   ALKPHOS 123 (H) 10/08/2020   BILITOT 0.5 10/08/2020   No results found for: 25OHVITD2, 25OHVITD3, VD25OH   Review of Systems  HENT:  Positive for sneezing. Negative for sinus pressure.   Respiratory:  Positive for cough (No production.) and shortness of breath.   Neurological:  Negative for headaches.   Patient Active Problem List   Diagnosis Date Noted   Slow transit constipation 02/11/2021   Coronary artery disease of native artery of native heart with stable angina pectoris ([place]) 10/08/2020   Plantar wart 05/02/2018   Hypertriglyceridemia 05/01/2018   Pre-diabetes 74/94/4967   Systolic heart failure  ([place]) 01/11/2017   History of non-ST elevation myocardial infarction (NSTEMI) 01/11/2017   Essential (primary) hypertension 08/14/2014   Allergic rhinitis, seasonal 08/14/2014    Allergies  Allergen Reactions   Ace Inhibitors Cough   Codeine Nausea And Vomiting    Past Surgical History:  Procedure Laterality Date   CORONARY ANGIOPLASTY WITH STENT PLACEMENT  01/2017   DES to LAD   [place]    Social History   Tobacco Use   Smoking status: Never   Smokeless tobacco: Never  Vaping Use   Vaping Use: Never used  Substance Use Topics   Alcohol use: Never    Alcohol/week: 0.0 standard drinks   Drug use: Never     Medication list has been reviewed and updated.  Current Meds  Medication Sig   amoxicillin-clavulanate (AUGMENTIN) 875-125 MG tablet Take 1 tablet by mouth 2 (two) times daily for 10 days.   Ascorbic Acid (VITAMIN C PO) Take by mouth daily.   aspirin 81 MG chewable tablet Chew 81 mg by mouth daily.   atorvastatin (LIPITOR) 80 MG tablet Take 1 tablet (80 mg total) by mouth daily.   carvedilol (COREG) 6.25 MG tablet Take 6.25 mg by mouth 3 (three) times daily. Patient takes 2 tablets in the morning and 1 tablet in the evening.   cetirizine (ZYRTEC) 10 MG tablet Take  1 tablet (10 mg total) by mouth daily.   Cyanocobalamin (VITAMIN B-12 PO) Take by mouth.   Ergocalciferol 50 MCG (2000 UT) CAPS Take 1 capsule by mouth daily.   fluticasone (FLONASE) 50 MCG/ACT nasal spray Place 2 sprays into both nostrils daily.   furosemide (LASIX) 40 MG tablet Take 40 mg by mouth daily.   losartan (COZAAR) 100 MG tablet Take 1 tablet (100 mg total) by mouth daily.   nitroGLYCERIN (NITROSTAT) 0.4 MG SL tablet Place 1 tablet (0.4 mg total) under the tongue every 5 (five) minutes as needed for chest pain.    PHQ 2/9 Scores 06/17/2021 04/09/2021 02/11/2021 01/23/2021  PHQ - 2 Score 0 0 0 0  PHQ- 9 Score 1 0 0 0    GAD 7 : Generalized Anxiety Score 06/17/2021  04/09/2021 02/11/2021 01/23/2021  Nervous, Anxious, on Edge 0 0 0 0  Control/stop worrying 0 0 0 0  Worry too much - different things 0 0 0 0  Trouble relaxing 0 0 0 0  Restless 0 0 0 0  Easily annoyed or irritable 0 0 0 0  Afraid - awful might happen 0 0 0 0  Total GAD 7 Score 0 0 0 0  Anxiety Difficulty Not difficult at all Not difficult at all - Not difficult at all    BP Readings from Last 3 Encounters:  06/17/21 (!) 142/88  04/09/21 139/76  02/11/21 140/90    Physical Exam Constitutional:      Appearance: She is well-developed.  HENT:     Right Ear: Ear canal and external ear normal. Tympanic membrane is erythematous and retracted.     Left Ear: Ear canal and external ear normal. Tympanic membrane is retracted. Tympanic membrane is not erythematous.     Nose:     Right Sinus: No maxillary sinus tenderness or frontal sinus tenderness.     Left Sinus: No maxillary sinus tenderness or frontal sinus tenderness.     Mouth/Throat:     Mouth: No oral lesions.     Pharynx: Uvula midline. No oropharyngeal exudate or posterior oropharyngeal erythema.  Cardiovascular:     Rate and Rhythm: Normal rate and regular rhythm.     Pulses: Normal pulses.     Heart sounds: Normal heart sounds.  Pulmonary:     Effort: Pulmonary effort is normal.     Breath sounds: Normal breath sounds. No wheezing, rhonchi or rales.  Musculoskeletal:     Cervical back: Normal range of motion.  Lymphadenopathy:     Cervical: No cervical adenopathy.  Neurological:     Mental Status: She is alert and oriented to person, place, and time.    Wt Readings from Last 3 Encounters:  06/17/21 177 lb 9.6 oz (80.6 kg)  04/09/21 178 lb (80.7 kg)  02/11/21 187 lb (84.8 kg)    BP (!) 142/88 (BP Location: Right Arm, Cuff Size: Large)    Pulse 62    Temp 98.6 F (37 C) (Oral)    Ht _0  (1.575 m)    Wt 177 lb 9.6 oz (80.6 kg)    SpO2 97%    BMI 32.48 kg/m   Assessment and Plan: 1. Acute otitis media, unspecified  otitis media type Suspect viral illness now leading into otitis media and sinusitis. Continue Flonase spray and zyrtec daily. Follow up if no improvement. 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 - amoxicillin-clavulanate (AUGMENTIN) 875-125 MG tablet; Take 1 tablet by mouth 2 (two) times daily  for 10 days.  Dispense: 20 tablet; Refill: 0   Partially dictated using Editor, commissioning. Any errors are unintentional.  Halina Maidens, MD [facility]  06/17/2021

## 2021-07-06 ENCOUNTER — Other Ambulatory Visit: Payer: Self-pay

## 2021-07-06 ENCOUNTER — Ambulatory Visit (INDEPENDENT_AMBULATORY_CARE_PROVIDER_SITE_OTHER): Payer: Medicare Other

## 2021-07-06 DIAGNOSIS — Z1211 Encounter for screening for malignant neoplasm of colon: Secondary | ICD-10-CM

## 2021-07-06 DIAGNOSIS — Z0000 Encounter for general adult medical examination without abnormal findings: Secondary | ICD-10-CM | POA: Diagnosis not present

## 2021-07-06 DIAGNOSIS — Z1231 Encounter for screening mammogram for malignant neoplasm of breast: Secondary | ICD-10-CM

## 2021-07-06 MED ORDER — NA SULFATE-K SULFATE-MG SULF 17.5-3.13-1.6 GM/177ML PO SOLN: 1.0000 | Freq: Once | ORAL | 0 refills | Status: AC

## 2021-07-06 NOTE — Progress Notes (Signed)
.th  Subjective:   Cassidy Dawson is a 66 y.o. female who presents for Medicare Annual (Subsequent) preventive examination.  Virtual Visit via Telephone Note  I connected with  [name] on 07/06/21 at  9:20 AM EST by telephone and verified that I am speaking with the correct person using two identifiers.  Location: Patient: home Provider: [facility] Persons participating in the virtual visit: patient/Nurse Health Advisor   I discussed the limitations, risks, security and privacy concerns of performing an evaluation and management service by telephone and the availability of in person appointments. The patient expressed understanding and agreed to proceed.  Interactive audio and video telecommunications were attempted between this nurse and patient, however failed, due to patient having technical difficulties OR patient did not have access to video capability.  We continued and completed visit with audio only.  Some vital signs may be absent or patient reported.   Reather Littler, LPN   Review of Systems     Cardiac Risk Factors include: advanced age (>57men, >62 women);hypertension;dyslipidemia     Objective:    There were no vitals filed for this visit. There is no height or weight on file to calculate BMI.  Advanced Directives 07/06/2021 06/30/2020  Does Patient Have a Medical Advance Directive? No No  Would patient like information on creating a medical advance directive? Yes (MAU/Ambulatory/Procedural Areas - Information given) Yes (MAU/Ambulatory/Procedural Areas - Information given)    Current Medications (verified) Outpatient Encounter Medications as of 07/06/2021  Medication Sig   Ascorbic Acid (VITAMIN C PO) Take by mouth daily.   aspirin 81 MG chewable tablet Chew 81 mg by mouth daily.   atorvastatin (LIPITOR) 80 MG tablet Take 1 tablet (80 mg total) by mouth daily.   carvedilol (COREG) 6.25 MG tablet Take 6.25 mg by mouth 3 (three) times daily. Patient  takes 2 tablets in the morning and 1 tablet in the evening.   cetirizine (ZYRTEC) 10 MG tablet Take 1 tablet (10 mg total) by mouth daily.   Cyanocobalamin (VITAMIN B-12 PO) Take by mouth.   Ergocalciferol 50 MCG (2000 UT) CAPS Take 1 capsule by mouth daily.   fluticasone (FLONASE) 50 MCG/ACT nasal spray Place 2 sprays into both nostrils daily.   furosemide (LASIX) 40 MG tablet Take 40 mg by mouth daily.   losartan (COZAAR) 100 MG tablet Take 1 tablet (100 mg total) by mouth daily.   nitroGLYCERIN (NITROSTAT) 0.4 MG SL tablet Place 1 tablet (0.4 mg total) under the tongue every 5 (five) minutes as needed for chest pain.   No facility-administered encounter medications on file as of 07/06/2021.    Allergies (verified) Ace inhibitors and Codeine   History: Past Medical History:  Diagnosis Date   Environmental and seasonal allergies    Flash pulmonary edema (HCC) 01/02/2021   Heart attack (HCC) 06/2016   Second: 01/2017    History of biliary T-tube placement 08/14/2014   Hyperlipidemia    Hypertension    Past Surgical History:  Procedure Laterality Date   CORONARY ANGIOPLASTY WITH STENT PLACEMENT  01/2017   DES to LAD   UTERINE FIBROID SURGERY  1996   Family History  Problem Relation Age of Onset   Hypertension Father    Kidney disease Father    Breast cancer Mother    Hypertension Brother    Renal Disease Brother    Social History   Socioeconomic History   Marital status: Married    Spouse name: Not on file   Number  of children: 0   Years of education: Not on file   Highest education level: Not on file  Occupational History   Not on file  Tobacco Use   Smoking status: Never   Smokeless tobacco: Never  Vaping Use   Vaping Use: Never used  Substance and Sexual Activity   Alcohol use: Never    Alcohol/week: 0.0 standard drinks   Drug use: Never   Sexual activity: Not Currently  Other Topics Concern   Not on file  Social History Narrative   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[employer]   Primary caregiver for mother who lives in [place]   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   Financial Resource Strain: Low Risk    Difficulty of Paying Living Expenses: Not very hard  Food Insecurity: No Food Insecurity   Worried About Programme researcher, broadcasting/film/video in the Last Year: Never true   Barista in the Last Year: Never true  Transportation Needs: No Transportation Needs   Lack of Transportation (Medical): No   Lack of Transportation (Non-Medical): No  Physical Activity: Inactive   Days of Exercise per Week: 0 days   Minutes of Exercise per Session: 0 min  Stress: No Stress Concern Present   Feeling of Stress : Not at all  Social Connections: Moderately Integrated   Frequency of Communication with Friends and Family: More than three times a week   Frequency of Social Gatherings with Friends and Family: More than three times a week   Attends Religious Services: More than 4 times per year   Active Member of [employer] or Organizations: No   Attends Engineer, structural: Never   Marital Status: Married    Tobacco Counseling Counseling given: Not Answered   Clinical Intake:  Pre-visit preparation completed: Yes  Pain : No/denies pain     Nutritional Risks: None Diabetes: No  How often do you need to have someone help you when you read instructions, pamphlets, or other written materials from your doctor or pharmacy?: 1 - Never   Interpreter Needed?: No  Information entered by :: Reather Littler LPN   Activities of Daily Living In your present state of health, do you have any difficulty performing the following activities: 07/06/2021 06/17/2021  Hearing? N N  Vision? N N  Difficulty concentrating or making decisions? N N  Walking or climbing stairs? N N  Dressing or bathing? N N  Doing errands, shopping? N N  Preparing Food and eating ? N -  Using the Toilet? N -  In the past six months, have you accidently leaked  urine? N -  Do you have problems with loss of bowel control? N -  Managing your Medications? N -  Managing your Finances? N -  Housekeeping or managing your Housekeeping? N -  Some recent data might be hidden    Patient Care Team: Reubin Milan, MD as PCP - General (Internal Medicine) Yisroel Ramming, MD as Referring Physician (Cardiology)  Indicate any recent Medical Services you may have received from other than [facility] providers in the past year (date may be approximate).     Assessment:   This is a routine wellness examination for [facility].  Hearing/Vision screen Hearing Screening - Comments:: Pt states hearing evaluation done at [facility] with mild hearing difficulty and may need hearing aid in the future Vision Screening - Comments:: Annual vision screenings done at [facility] Dr. Clearance Coots  Dietary issues and  exercise activities discussed: Current Exercise Habits: The patient has a physically strenuous job, but has no regular exercise apart from work., Exercise limited by: None identified   Goals Addressed             This Visit's Progress    Increase physical activity   On track    Recommend increasing physical activity to at least 3 days per week        Depression Screen PHQ 2/9 Scores 07/06/2021 06/17/2021 04/09/2021 02/11/2021 01/23/2021 01/14/2021 10/08/2020  PHQ - 2 Score 0 0 0 0 0 0 0  PHQ- 9 Score 2 1 0 0 0 3 1    Fall Risk Fall Risk  07/06/2021 06/17/2021 04/09/2021 02/11/2021 01/23/2021  Falls in the past year? 0 0 0 0 0  Number falls in past yr: 0 0 0 0 0  Injury with Fall? 0 0 0 0 0  Risk for fall due to : No Fall Risks No Fall Risks No Fall Risks No Fall Risks No Fall Risks  Follow up Falls prevention discussed Falls evaluation completed Falls evaluation completed Falls evaluation completed Falls evaluation completed    FALL RISK PREVENTION PERTAINING TO THE HOME:  Any stairs in or around the home? Yes  If so, are there any without handrails? Yes  - 1 step  [place]ide Home free of loose throw rugs in walkways, pet beds, electrical cords, etc? Yes  Adequate lighting in your home to reduce risk of falls? Yes   ASSISTIVE DEVICES UTILIZED TO PREVENT FALLS:  Life alert? No  Use of a cane, walker or w/c? No  Grab bars in the bathroom? Yes  Shower chair or bench in shower? No  Elevated toilet seat or a handicapped toilet? No   TIMED UP AND GO:  Was the test performed? No . Telephonic visit  Cognitive Function: Normal cognitive status assessed by direct observation by this Nurse Health Advisor. No abnormalities found.          Immunizations Immunization History  Administered Date(s) Administered   Fluad Quad(high Dose 65+) 01/14/2021   Influenza,inj,Quad PF,6+ Mos 06/28/2016, 05/02/2018, 01/22/2019   Janssen (J&J) SARS-COV-2 Vaccination 07/14/2019   PFIZER(Purple Top)SARS-COV-2 Vaccination 02/01/2020, 06/12/2021   Pneumococcal Conjugate-13 10/04/2019   Pneumococcal Polysaccharide-23 10/08/2020   Tdap 06/12/2021    TDAP status: Up to date  Flu Vaccine status: Up to date  Pneumococcal vaccine status: Up to date  Covid-19 vaccine status: Completed vaccines  Qualifies for Shingles Vaccine? Yes   Zostavax completed No   Shingrix Completed?: No.    Education has been provided regarding the importance of this vaccine. Patient has been advised to call insurance company to determine out of pocket expense if they have not yet received this vaccine. Advised may also receive vaccine at local pharmacy or Health Dept. Verbalized acceptance and understanding.  Screening Tests Health Maintenance  Topic Date Due   Zoster Vaccines- Shingrix (1 of 2) Never done   COLONOSCOPY (Pts 45-53yrs Insurance coverage will need to be confirmed)  Never done   COVID-19 Vaccine (4 - Booster for Janssen series) 08/07/2021   MAMMOGRAM  08/12/2021   TETANUS/TDAP  06/13/2031   Pneumonia Vaccine 39+ Years old  Completed   INFLUENZA VACCINE  Completed   DEXA SCAN   Completed   Hepatitis C Screening  Completed   HPV VACCINES  Aged Out    Health Maintenance  Health Maintenance Due  Topic Date Due   Zoster Vaccines- Shingrix (1 of 2) Never done  COLONOSCOPY (Pts 45-72yrs Insurance coverage will need to be confirmed)  Never done    Colorectal cancer screening: Referral to GI placed today. Pt aware the office will call re: appt.  Mammogram status: Completed 08/12/20. Repeat every year  Bone Density status: Completed 08/12/20. Results reflect: Bone density results: NORMAL. Repeat every 2 years.  Lung Cancer Screening: (Low Dose CT Chest recommended if Age 3-80 years, 30 pack-year currently smoking OR have quit w/in 15years.) does not qualify.   Additional Screening:  Hepatitis C Screening: does qualify; Completed 10/02/18  Vision Screening: Recommended annual ophthalmology exams for early detection of glaucoma and other disorders of the eye. Is the patient up to date with their annual eye exam?  Yes  Who is the provider or what is the name of the office in which the patient attends annual eye exams? Dr. Clearance Coots  Dental Screening: Recommended annual dental exams for proper oral hygiene  Community Resource Referral / Chronic Care Management: CRR required this visit?  No   CCM required this visit?  No      Plan:     I have personally reviewed and noted the following in the patients chart:   Medical and social history Use of alcohol, tobacco or illicit drugs  Current medications and supplements including opioid prescriptions.  Functional ability and status Nutritional status Physical activity Advanced directives List of other physicians Hospitalizations, surgeries, and ER visits in previous 12 months Vitals Screenings to include cognitive, depression, and falls Referrals and appointments  In addition, I have reviewed and discussed with patient certain preventive protocols, quality metrics, and best practice recommendations. A written  personalized care plan for preventive services as well as general preventive health recommendations were provided to patient.     Reather Littler, LPN   09/06/4330   Nurse Notes: pt seen in office 06/17/21 for ear infection/URI sxs. Pt states she is still having cough at times and congested in the mornings with dry mouth, possibly due to gas heat. Pt advised to contact office if sxs worsen or persist.

## 2021-07-06 NOTE — Patient Instructions (Signed)
[name] , Thank you for taking time to come for your Medicare Wellness Visit. I appreciate your ongoing commitment to your health goals. Please review the following plan we discussed and let me know if I can assist you in the future.   Screening recommendations/referrals: Colonoscopy: A referral has been sent to [facility] for screening colonoscopy. They will contact you for an appointment.  Mammogram: done 08/12/20. Please call [phone] to schedule your mammogram.  Bone Density: done 08/12/20 Recommended yearly ophthalmology/optometry visit for glaucoma screening and checkup Recommended yearly dental visit for hygiene and checkup  Vaccinations: Influenza vaccine: done 01/14/21 Pneumococcal vaccine: done 10/08/20 Tdap vaccine: done 06/12/21 Shingles vaccine: Shingrix discussed. Please contact your pharmacy for coverage information.  Covid-19:done 07/14/19, 02/01/20 & 06/12/21  Advanced directives: Advance directive discussed with you today. I have provided a copy for you to complete at home and have notarized. Once this is complete please bring a copy in to our office so we can scan it into your chart.   Conditions/risks identified: Recommend increasing physical activity   Next appointment: Follow up in one year for your annual wellness visit    Preventive Care 65 Years and Older, Female Preventive care refers to lifestyle choices and visits with your health care provider that can promote health and wellness. What does preventive care include? A yearly physical exam. This is also called an annual well check. Dental exams once or twice a year. Routine eye exams. Ask your health care provider how often you should have your eyes checked. Personal lifestyle choices, including: Daily care of your teeth and gums. Regular physical activity. Eating a healthy diet. Avoiding tobacco and drug use. Limiting alcohol use. Practicing safe sex. Taking low-dose aspirin every  day. Taking vitamin and mineral supplements as recommended by your health care provider. What happens during an annual well check? The services and screenings done by your health care provider during your annual well check will depend on your age, overall health, lifestyle risk factors, and family history of disease. Counseling  Your health care provider may ask you questions about your: Alcohol use. Tobacco use. Drug use. Emotional well-being. Home and relationship well-being. Sexual activity. Eating habits. History of falls. Memory and ability to understand (cognition). Work and work Astronomer. Reproductive health. Screening  You may have the following tests or measurements: Height, weight, and BMI. Blood pressure. Lipid and cholesterol levels. These may be checked every 5 years, or more frequently if you are over 14 years old. Skin check. Lung cancer screening. You may have this screening every year starting at age 45 if you have a 30-pack-year history of smoking and currently smoke or have quit within the past 15 years. Fecal occult blood test (FOBT) of the stool. You may have this test every year starting at age 27. Flexible sigmoidoscopy or colonoscopy. You may have a sigmoidoscopy every 5 years or a colonoscopy every 10 years starting at age 53. Hepatitis C blood test. Hepatitis B blood test. Sexually transmitted disease (STD) testing. Diabetes screening. This is done by checking your blood sugar (glucose) after you have not eaten for a while (fasting). You may have this done every 1-3 years. Bone density scan. This is done to screen for osteoporosis. You may have this done starting at age 96. Mammogram. This may be done every 1-2 years. Talk to your health care provider about how often you should have regular mammograms. Talk with your health care provider about your test results, treatment options, and if  necessary, the need for more tests. Vaccines  Your health care  provider may recommend certain vaccines, such as: Influenza vaccine. This is recommended every year. Tetanus, diphtheria, and acellular pertussis (Tdap, Td) vaccine. You may need a Td booster every 10 years. Zoster vaccine. You may need this after age 68. Pneumococcal 13-valent conjugate (PCV13) vaccine. One dose is recommended after age 30. Pneumococcal polysaccharide (PPSV23) vaccine. One dose is recommended after age 60. Talk to your health care provider about which screenings and vaccines you need and how often you need them. This information is not intended to replace advice given to you by your health care provider. Make sure you discuss any questions you have with your health care provider. Document Released: 05/16/2015 Document Revised: 01/07/2016 Document Reviewed: 02/18/2015 Elsevier Interactive Patient Education  2017 [place] Prevention in the Home Falls can cause injuries. They can happen to people of all ages. There are many things you can do to make your home safe and to help prevent falls. What can I do on the outside of my home? Regularly fix the edges of walkways and driveways and fix any cracks. Remove anything that might make you trip as you walk through a door, such as a raised step or threshold. Trim any bushes or trees on the path to your home. Use bright outdoor lighting. Clear any walking paths of anything that might make someone trip, such as rocks or tools. Regularly check to see if handrails are loose or broken. Make sure that both sides of any steps have handrails. Any raised decks and porches should have guardrails on the edges. Have any leaves, snow, or ice cleared regularly. Use sand or salt on walking paths during winter. Clean up any spills in your garage right away. This includes oil or grease spills. What can I do in the bathroom? Use night lights. Install grab bars by the toilet and in the tub and shower. Do not use towel bars as grab  bars. Use non-skid mats or decals in the tub or shower. If you need to sit down in the shower, use a plastic, non-slip stool. Keep the floor dry. Clean up any water that spills on the floor as soon as it happens. Remove soap buildup in the tub or shower regularly. Attach bath mats securely with double-sided non-slip rug tape. Do not have throw rugs and other things on the floor that can make you trip. What can I do in the bedroom? Use night lights. Make sure that you have a light by your bed that is easy to reach. Do not use any sheets or blankets that are too big for your bed. They should not hang down onto the floor. Have a firm chair that has side arms. You can use this for support while you get dressed. Do not have throw rugs and other things on the floor that can make you trip. What can I do in the kitchen? Clean up any spills right away. Avoid walking on wet floors. Keep items that you use a lot in easy-to-reach places. If you need to reach something above you, use a strong step stool that has a grab bar. Keep electrical cords out of the way. Do not use floor polish or wax that makes floors slippery. If you must use wax, use non-skid floor wax. Do not have throw rugs and other things on the floor that can make you trip. What can I do with my stairs? Do not leave any items  on the stairs. Make sure that there are handrails on both sides of the stairs and use them. Fix handrails that are broken or loose. Make sure that handrails are as long as the stairways. Check any carpeting to make sure that it is firmly attached to the stairs. Fix any carpet that is loose or worn. Avoid having throw rugs at the top or bottom of the stairs. If you do have throw rugs, attach them to the floor with carpet tape. Make sure that you have a light switch at the top of the stairs and the bottom of the stairs. If you do not have them, ask someone to add them for you. What else can I do to help prevent  falls? Wear shoes that: Do not have high heels. Have rubber bottoms. Are comfortable and fit you well. Are closed at the toe. Do not wear sandals. If you use a stepladder: Make sure that it is fully opened. Do not climb a closed stepladder. Make sure that both sides of the stepladder are locked into place. Ask someone to hold it for you, if possible. Clearly mark and make sure that you can see: Any grab bars or handrails. First and last steps. Where the edge of each step is. Use tools that help you move around (mobility aids) if they are needed. These include: Canes. Walkers. Scooters. Crutches. Turn on the lights when you go into a dark area. Replace any light bulbs as soon as they burn out. Set up your furniture so you have a clear path. Avoid moving your furniture around. If any of your floors are uneven, fix them. If there are any pets around you, be aware of where they are. Review your medicines with your doctor. Some medicines can make you feel dizzy. This can increase your chance of falling. Ask your doctor what other things that you can do to help prevent falls. This information is not intended to replace advice given to you by your health care provider. Make sure you discuss any questions you have with your health care provider. Document Released: 02/13/2009 Document Revised: 09/25/2015 Document Reviewed: 05/24/2014 Elsevier Interactive Patient Education  2017 [employer].

## 2021-07-06 NOTE — Progress Notes (Signed)
Gastroenterology Pre-Procedure Review ? ?Request Date: 07/23/2021 ?Requesting Physician: Dr. Allegra Lai ? ?PATIENT REVIEW QUESTIONS: The patient responded to the following health history questions as indicated:   ? ?1. Are you having any GI issues? no ?2. Do you have a personal history of Polyps? no ?3. Do you have a family history of Colon Cancer or Polyps? no ?4. Diabetes Mellitus? no ?5. Joint replacements in the past 12 months?no ?6. Major health problems in the past 3 months?no ?7. Any artificial heart valves, MVP, or defibrillator?no ?   ?MEDICATIONS & ALLERGIES:    ?Patient reports the following regarding taking any anticoagulation/antiplatelet therapy:   ?Plavix, Coumadin, Eliquis, Xarelto, Lovenox, Pradaxa, Brilinta, or Effient? no ?Aspirin? yes (81 mg) ? ?Patient confirms/reports the following medications:  ?Current Outpatient Medications  ?Medication Sig Dispense Refill  ? Ascorbic Acid (VITAMIN C PO) Take by mouth daily.    ? aspirin 81 MG chewable tablet Chew 81 mg by mouth daily.    ? atorvastatin (LIPITOR) 80 MG tablet Take 1 tablet (80 mg total) by mouth daily. 90 tablet 3  ? carvedilol (COREG) 6.25 MG tablet Take 6.25 mg by mouth 3 (three) times daily. Patient takes 2 tablets in the morning and 1 tablet in the evening.    ? cetirizine (ZYRTEC) 10 MG tablet Take 1 tablet (10 mg total) by mouth daily. 90 tablet 1  ? Cyanocobalamin (VITAMIN B-12 PO) Take by mouth.    ? Ergocalciferol 50 MCG (2000 UT) CAPS Take 1 capsule by mouth daily.    ? fluticasone (FLONASE) 50 MCG/ACT nasal spray Place 2 sprays into both nostrils daily. 48 g 1  ? furosemide (LASIX) 40 MG tablet Take 40 mg by mouth daily.    ? losartan (COZAAR) 100 MG tablet Take 1 tablet (100 mg total) by mouth daily. 90 tablet 1  ? nitroGLYCERIN (NITROSTAT) 0.4 MG SL tablet Place 1 tablet (0.4 mg total) under the tongue every 5 (five) minutes as needed for chest pain. 25 tablet 1  ? ?No current facility-administered medications for this visit.   ? ? ?Patient confirms/reports the following allergies:  ?Allergies  ?Allergen Reactions  ? Ace Inhibitors Cough  ? Codeine Nausea And Vomiting  ? ? ?No orders of the defined types were placed in this encounter. ? ? ?AUTHORIZATION INFORMATION ?Primary Insurance: ?1D#: ?Group #: ? ?Secondary Insurance: ?1D#: ?Group #: ? ?SCHEDULE INFORMATION: ?Date: 07/23/2021 ?Time: ?Location:[facility] ? ? ?

## 2021-07-10 ENCOUNTER — Ambulatory Visit (INDEPENDENT_AMBULATORY_CARE_PROVIDER_SITE_OTHER): Payer: Medicare Other

## 2021-07-10 ENCOUNTER — Ambulatory Visit: Payer: Self-pay | Admitting: *Deleted

## 2021-07-10 ENCOUNTER — Ambulatory Visit: Payer: Medicare Other | Admitting: Internal Medicine

## 2021-07-10 ENCOUNTER — Other Ambulatory Visit: Payer: Self-pay

## 2021-07-10 ENCOUNTER — Ambulatory Visit
Admission: EM | Admit: 2021-07-10 | Discharge: 2021-07-10 | Disposition: A | Discharge status: Other Acute Inpatient Hospital | Payer: Medicare Other | Attending: Emergency Medicine | Admitting: Emergency Medicine

## 2021-07-10 DIAGNOSIS — I248 Other forms of acute ischemic heart disease: Secondary | ICD-10-CM | POA: Diagnosis not present

## 2021-07-10 DIAGNOSIS — R918 Other nonspecific abnormal finding of lung field: Secondary | ICD-10-CM | POA: Diagnosis not present

## 2021-07-10 DIAGNOSIS — I5022 Chronic systolic (congestive) heart failure: Secondary | ICD-10-CM | POA: Diagnosis not present

## 2021-07-10 DIAGNOSIS — I2510 Atherosclerotic heart disease of native coronary artery without angina pectoris: Secondary | ICD-10-CM | POA: Diagnosis not present

## 2021-07-10 DIAGNOSIS — I517 Cardiomegaly: Secondary | ICD-10-CM | POA: Diagnosis not present

## 2021-07-10 DIAGNOSIS — I5020 Unspecified systolic (congestive) heart failure: Secondary | ICD-10-CM | POA: Diagnosis not present

## 2021-07-10 DIAGNOSIS — I110 Hypertensive heart disease with heart failure: Secondary | ICD-10-CM | POA: Diagnosis not present

## 2021-07-10 DIAGNOSIS — R0902 Hypoxemia: Secondary | ICD-10-CM | POA: Diagnosis not present

## 2021-07-10 DIAGNOSIS — R0602 Shortness of breath: Secondary | ICD-10-CM

## 2021-07-10 DIAGNOSIS — R0989 Other specified symptoms and signs involving the circulatory and respiratory systems: Secondary | ICD-10-CM | POA: Diagnosis not present

## 2021-07-10 DIAGNOSIS — Z7982 Long term (current) use of aspirin: Secondary | ICD-10-CM | POA: Diagnosis not present

## 2021-07-10 DIAGNOSIS — R748 Abnormal levels of other serum enzymes: Secondary | ICD-10-CM | POA: Diagnosis not present

## 2021-07-10 DIAGNOSIS — R059 Cough, unspecified: Secondary | ICD-10-CM | POA: Diagnosis not present

## 2021-07-10 DIAGNOSIS — I252 Old myocardial infarction: Secondary | ICD-10-CM | POA: Diagnosis not present

## 2021-07-10 DIAGNOSIS — I214 Non-ST elevation (NSTEMI) myocardial infarction: Secondary | ICD-10-CM | POA: Diagnosis not present

## 2021-07-10 DIAGNOSIS — Z79899 Other long term (current) drug therapy: Secondary | ICD-10-CM | POA: Diagnosis not present

## 2021-07-10 DIAGNOSIS — E782 Mixed hyperlipidemia: Secondary | ICD-10-CM | POA: Diagnosis not present

## 2021-07-10 DIAGNOSIS — J1282 Pneumonia due to coronavirus disease 2019: Secondary | ICD-10-CM | POA: Diagnosis not present

## 2021-07-10 DIAGNOSIS — I5042 Chronic combined systolic (congestive) and diastolic (congestive) heart failure: Secondary | ICD-10-CM | POA: Diagnosis not present

## 2021-07-10 DIAGNOSIS — J302 Other seasonal allergic rhinitis: Secondary | ICD-10-CM | POA: Diagnosis not present

## 2021-07-10 DIAGNOSIS — U071 COVID-19: Secondary | ICD-10-CM | POA: Diagnosis not present

## 2021-07-10 NOTE — Telephone Encounter (Signed)
Pt advised to go to UC to get chest xray and blood work done. ? ?KP ?

## 2021-07-10 NOTE — ED Triage Notes (Signed)
Patient presents to Urgent Care with complaints of SOB x 3 weeks she states it worsened today. Pt states she reported this problem at her last visit with PCP, she states it was believed to be related to her allergies. Treating symptoms with her allergy meds. Pt reports her coreg was increased back in Feb.  ? ?Denies chest pain or fever.  ?

## 2021-07-10 NOTE — Discharge Instructions (Addendum)
Please go to the emergency department for evaluation of your worsening shortness of breath as I am concerned you are developing flash pulmonary edema similar to what you had in September 2022. ?

## 2021-07-10 NOTE — ED Provider Notes (Signed)
[facility]    CSN: [id number] Arrival date & time: 07/10/21  1452      History   Chief Complaint Chief Complaint  Patient presents with   Shortness of Breath    HPI Cassidy Dawson is a 66 y.o. female.   HPI  66 year old female here for evaluation of respiratory complaints.  Patient reports that she has been experiencing shortness of breath for the past 3 weeks that he has been attributing to allergies but she reports that she has had incrementally increasing shortness of breath throughout the day today.  This is associated with a nonproductive cough, nausea, and sweating.  Patient is hypertensive but reports that she has taken her blood pressure medication.  She has a history of NSTEMI, central hypertension, and flash pulmonary edema.  Patient was admitted on 01/02/2021 to [facility] for flash pulmonary edema or at that time she presented with shortness of breath, occasional cough, and increased blood pressure.  Past Medical History:  Diagnosis Date   Environmental and seasonal allergies    Flash pulmonary edema (HCC) 01/02/2021   Heart attack (HCC) 06/2016   Second: 01/2017    History of biliary T-tube placement 08/14/2014   Hyperlipidemia    Hypertension     Patient Active Problem List   Diagnosis Date Noted   Slow transit constipation 02/11/2021   Coronary artery disease of native artery of native heart with stable angina pectoris (HCC) 10/08/2020   Plantar wart 05/02/2018   Hypertriglyceridemia 05/01/2018   Pre-diabetes 05/01/2018   Systolic heart failure (HCC) 01/11/2017   History of non-ST elevation myocardial infarction (NSTEMI) 01/11/2017   Essential (primary) hypertension 08/14/2014   Allergic rhinitis, seasonal 08/14/2014    Past Surgical History:  Procedure Laterality Date   CORONARY ANGIOPLASTY WITH STENT PLACEMENT  01/2017   DES to LAD   UTERINE FIBROID SURGERY  1996    OB History   No obstetric history on file.      Home  Medications    Prior to Admission medications   Medication Sig Start Date End Date Taking? Authorizing Provider  Ascorbic Acid (VITAMIN C PO) Take by mouth daily.    [provider]  aspirin 81 MG chewable tablet Chew 81 mg by mouth daily. 12/13/16   [provider]  atorvastatin (LIPITOR) 80 MG tablet Take 1 tablet (80 mg total) by mouth daily. 10/08/20 10/08/21  Reubin Milan, MD  carvedilol (COREG) 6.25 MG tablet Take 6.25 mg by mouth 3 (three) times daily. Patient takes 2 tablets in the morning and 1 tablet in the evening.    [provider]  cetirizine (ZYRTEC) 10 MG tablet Take 1 tablet (10 mg total) by mouth daily. 04/09/21   Reubin Milan, MD  Cyanocobalamin (VITAMIN B-12 PO) Take by mouth.    [provider]  Ergocalciferol 50 MCG (2000 UT) CAPS Take 1 capsule by mouth daily.    [provider]  fluticasone (FLONASE) 50 MCG/ACT nasal spray Place 2 sprays into both nostrils daily. 04/09/21   Reubin Milan, MD  furosemide (LASIX) 40 MG tablet Take 40 mg by mouth daily. 01/05/21   [provider]  losartan (COZAAR) 100 MG tablet Take 1 tablet (100 mg total) by mouth daily. 02/11/21   Reubin Milan, MD  nitroGLYCERIN (NITROSTAT) 0.4 MG SL tablet Place 1 tablet (0.4 mg total) under the tongue every 5 (five) minutes as needed for chest pain. 04/01/20   Reubin Milan, MD  Family History Family History  Problem Relation Age of Onset   Hypertension Father    Kidney disease Father    Breast cancer Mother    Hypertension Brother    Renal Disease Brother     Social History Social History   Tobacco Use   Smoking status: Never   Smokeless tobacco: Never  Vaping Use   Vaping Use: Never used  Substance Use Topics   Alcohol use: Never    Alcohol/week: 0.0 standard drinks   Drug use: Never     Allergies   Ace inhibitors and Codeine   Review of Systems Review of Systems  Constitutional:  Positive for diaphoresis.   Eyes:  Negative for visual disturbance.  Respiratory:  Positive for cough and shortness of breath. Negative for wheezing.   Cardiovascular:  Negative for chest pain, palpitations and leg swelling.  Gastrointestinal:  Positive for nausea.  Neurological:  Negative for dizziness.  Hematological: Negative.   Psychiatric/Behavioral: Negative.      Physical Exam Triage Vital Signs ED Triage Vitals  Enc Vitals Group     BP 07/10/21 1503 (S) (!) 192/111     Pulse Rate 07/10/21 1503 68     Resp 07/10/21 1503 20     Temp 07/10/21 1503 97.8 F (36.6 C)     Temp Source 07/10/21 1503 Oral     SpO2 07/10/21 1503 94 %     Weight --      Height --      Head Circumference --      Peak Flow --      Pain Score 07/10/21 1501 0     Pain Loc --      Pain Edu? --      Excl. in GC? --    No data found.  Updated Vital Signs BP (!) 193/125 (BP Location: Left Arm)    Pulse 68    Temp 97.8 F (36.6 C) (Oral)    Resp 20    SpO2 94%   Visual Acuity Right Eye Distance:   Left Eye Distance:   Bilateral Distance:    Right Eye Near:   Left Eye Near:    Bilateral Near:     Physical Exam Vitals and nursing note reviewed.  Constitutional:      Appearance: Normal appearance. She is not ill-appearing.  HENT:     Head: Normocephalic and atraumatic.     Mouth/Throat:     Mouth: Mucous membranes are moist.     Pharynx: Oropharynx is clear. No posterior oropharyngeal erythema.  Neck:     Vascular: No carotid bruit.  Cardiovascular:     Rate and Rhythm: Normal rate and regular rhythm.     Pulses: Normal pulses.     Heart sounds: Normal heart sounds. No murmur heard.   No friction rub. No gallop.  Pulmonary:     Effort: Pulmonary effort is normal.     Breath sounds: No wheezing, rhonchi or rales.  Musculoskeletal:     Cervical back: Normal range of motion and neck supple.  Skin:    General: Skin is warm and dry.     Capillary Refill: Capillary refill takes less than 2 seconds.     Findings:  No erythema or rash.  Neurological:     General: No focal deficit present.     Mental Status: She is alert and oriented to person, place, and time.  Psychiatric:        Mood and Affect: Mood normal.  Behavior: Behavior normal.        Thought Content: Thought content normal.        Judgment: Judgment normal.     UC Treatments / Results  Labs (all labs ordered are listed, but only abnormal results are displayed) Labs Reviewed - No data to display  EKG Normal sinus rhythm with ventricular rate of 69 bpm PR interval 100 ms QRS duration 84 ms QT/QTc 426/456 ms There is T wave flattening and inversion in lead I and aVL as well as mild elevation in lead II lead III and aVF.  There are reciprocal changes in V4 and V5 with T wave flattening and inversion and flattening of the T wave in V6.   Radiology DG Chest 2 View  Result Date: 07/10/2021 CLINICAL DATA:  Shortness of breath EXAM: CHEST - 2 VIEW COMPARISON:  Chest x-ray 01/04/2013 FINDINGS: Heart is enlarged. Mediastinum appears stable. Calcified plaques in the aortic arch. Pulmonary vasculature is normal. No focal consolidation, pleural effusion or pneumothorax identified. IMPRESSION: Cardiomegaly with no acute process identified. Electronically Signed   By: Jannifer Hick M.D.   On: 07/10/2021 15:35    Procedures Procedures (including critical care time)  Medications Ordered in UC Medications - No data to display  Initial Impression / Assessment and Plan / UC Course  I have reviewed the triage vital signs and the nursing notes.  Pertinent labs & imaging results that were available during my care of the patient were reviewed by me and considered in my medical decision making (see chart for details).  Patient is a nontoxic-appearing 66 year old female here for evaluation of shortness of breath.  This is associated with a nonproductive cough, nausea, and sweating.  Shortness of breath has been going on for 3 weeks but is  recently increased throughout the day today while she was at work.  She denies chest pain, dizziness, or change in vision.  Also denies swelling of feet or ankles.  No fever.  Patient was admitted to [facility] on 01/02/2021 with a similar presentation for flash pulmonary edema that presented with elevated blood pressure, shortness of breath, and a nonproductive cough.  Patient's blood pressure is 193/125 here in clinic and she states that she did take her blood pressure medicine today.  She is recently had an increase in her Coreg.  She also takes Lasix.  Physical exam reveals cardiopulmonary exam with S1-S2 heart sounds with regular rate and rhythm.  Lung sounds are clear but decreased in all fields.  Patient has no swelling of her feet or ankles and her peripheral pulses are 2+ globally.  EKG shows ST flattening and depression in 1 and aVL with reciprocal mild ST elevation in 2 3 and aVF.  There is also T wave flattening and inversion in V4 and V5 with flattening in V6.  Given patient's previous presentation and heart history I am concerned that she is developing flash pulmonary edema again and I feel it is prudent for her to be evaluated in the emergency department.  We will call EMS to have her transported to the emergency department.  Patient is refusing to go via EMS to the hospital as she wants her husband to drive her to [facility] where her cardiologist is.  I have advised the patient that I am concerned she is developing flash pulmonary edema and that if she wants to go by POV to [facility] that is fine but she will have to sign out AMA and she is agreeable to that.  Chest x-ray independently reviewed and evaluated by me.  Impression: There are hazy patchy opacities in bilateral bases.  Radiology overread is pending. Radiology interpretation states heart is enlarged but mediastinum appears stable.  Pulmonary vasculature is normal but no focal consolidation, pleural effusion, or pneumothorax identified.   Final Clinical  Impressions(s) / UC Diagnoses   Final diagnoses:  Shortness of breath     Discharge Instructions      Please go to the emergency department for evaluation of your worsening shortness of breath as I am concerned you are developing flash pulmonary edema similar to what you had in September 2022.     ED Prescriptions   None    PDMP not reviewed this encounter.   Becky Augusta, NP 07/10/21 1541

## 2021-07-10 NOTE — ED Notes (Signed)
Provider at bedside, aware of abnormal vitals.  ?

## 2021-07-10 NOTE — ED Notes (Signed)
Patient is being discharged from the Urgent Care and sent to the Emergency Department via POV . Per Alycia Rossetti, NP, patient is in need of higher level of care due to SOB and changes in her EKG. Patient is aware and verbalizes understanding of plan of care.  ?Vitals:  ? 07/10/21 1503 07/10/21 1505  ?BP: (S) (!) 192/111 (!) 193/125  ?Pulse: 68   ?Resp: 20   ?Temp: 97.8 ?F (36.6 ?C)   ?SpO2: 94%   ?  ?

## 2021-07-10 NOTE — Telephone Encounter (Signed)
?  Chief Complaint: cough and difficulty breathing with exertion, requesting cough medication ?Symptoms: nonproductive cough, shortness of breath with exertion. Difficulty breathing reported more often. Comes and goes, can not lay flat on back lays on sides. "Feels like pneumonia" ?Frequency: worsening since 06/17/21 OV ?Pertinent Negatives: Patient denies chest pain , fever, sweating, dizziness.  ?Disposition: [] ED /[] Urgent Care (no appt availability in office) / [x] Appointment(In office/virtual)/ []  [place] [facility]/ [] Home Care/ [] Refused Recommended Disposition /[] [place] [facility]/ []  Follow-up with PCP ?Additional Notes:  ? ?Appt scheduled today . ? ? Reason for Disposition ? [1] MILD difficulty breathing (e.g., minimal/no SOB at rest, SOB with walking, pulse <100) AND [2] NEW-onset or WORSE than normal ? ?Answer Assessment - Initial Assessment Questions ?1. RESPIRATORY STATUS: "Describe your breathing?" (e.g., wheezing, shortness of breath, unable to speak, severe coughing)  ?    Shortness of breath comes and goes with exertion ?2. ONSET: "When did this breathing problem begin?"  ?    When the cough started 06/17/21 ?3. PATTERN "Does the difficult breathing come and go, or has it been constant since it started?"  ?    Comes and goes  ?4. SEVERITY: "How bad is your breathing?" (e.g., mild, moderate, severe)  ?  - MILD: No SOB at rest, mild SOB with walking, speaks normally in sentences, can lie down, no retractions, pulse < 100.  ?  - MODERATE: SOB at rest, SOB with minimal exertion and prefers to sit, cannot lie down flat, speaks in phrases, mild retractions, audible wheezing, pulse 100-120.  ?  - SEVERE: Very SOB at rest, speaks in single words, struggling to breathe, sitting hunched forward, retractions, pulse > 120  ?    Moderate  ?5. RECURRENT SYMPTOM: "Have you had difficulty breathing before?" If Yes, ask: "When was the last time?" and "What happened that time?"  ?    Yes , bronchitis  ?6.  CARDIAC HISTORY: "Do you have any history of heart disease?" (e.g., heart attack, angina, bypass surgery, angioplasty)  ?    na ?7. LUNG HISTORY: "Do you have any history of lung disease?"  (e.g., pulmonary embolus, asthma, emphysema) ?    Hx bronchitis  ?8. CAUSE: "What do you think is causing the breathing problem?"  ?    Not sure feels like "pneumonia " ?9. OTHER SYMPTOMS: "Do you have any other symptoms? (e.g., dizziness, runny nose, cough, chest pain, fever) ?    Cough non productive. Shortness of breath ?10. O2 SATURATION MONITOR:  "Do you use an oxygen saturation monitor (pulse oximeter) at home?" If Yes, "What is your reading (oxygen level) today?" "What is your usual oxygen saturation reading?" (e.g., 95%) ?      93 % room aire  ?11. PREGNANCY: "Is there any chance you are pregnant?" "When was your last menstrual period?" ?      na ?12. TRAVEL: "Have you traveled out of the country in the last month?" (e.g., travel history, exposures) ?      na ? ?Protocols used: Breathing Difficulty-A-AH ? ?

## 2021-07-15 ENCOUNTER — Telehealth: Payer: Self-pay

## 2021-07-15 NOTE — Telephone Encounter (Signed)
Called pt as a reminder to call and schedule mammogram. Pt verbalized understanding.  KP 

## 2021-07-15 NOTE — Telephone Encounter (Signed)
Transition Care Management Follow-up Telephone Call ?Date of discharge and from where[facility] ?How have you been since you were released from the hospital? Pt states she is feeling better ?Any questions or concerns? No ? ?Items Reviewed: ?Did the pt receive and understand the discharge instructions provided? Yes  ?Medications obtained and verified? Yes  ?Other? No  ?Any new allergies since your discharge? No  ?Dietary orders reviewed? Yes ?Do you have support at home? Yes  ? ?Home Care and Equipment/Supplies: ?Were home health services ordered? no ? ?Were any new equipment or medical supplies ordered?  No ? ?Functional Questionnaire: (I = Independent and D = Dependent) ?ADLs: I ? ?Bathing/Dressing- I ? ?Meal Prep- I ? ?Eating- I ? ?Maintaining continence- I ? ?Transferring/Ambulation- I ? ?Managing Meds- I ? ?Follow up appointments reviewed: ? ?PCP Hospital f/u appt confirmed? Yes  Scheduled to see Dr. Judithann Graves on 07/21/21 @ 2:00. ?Specialist Hospital f/u appt confirmed? Yes  Scheduled to see cardiology on 09/01/21. ?Are transportation arrangements needed? No  ?If their condition worsens, is the pt aware to call PCP or go to the Emergency Dept.? Yes ?Was the patient provided with contact information for the PCP's office or ED? Yes ?Was to pt encouraged to call back with questions or concerns? Yes ? ?

## 2021-07-21 ENCOUNTER — Ambulatory Visit (INDEPENDENT_AMBULATORY_CARE_PROVIDER_SITE_OTHER): Payer: Medicare Other | Admitting: Internal Medicine

## 2021-07-21 ENCOUNTER — Other Ambulatory Visit: Payer: Self-pay

## 2021-07-21 ENCOUNTER — Encounter: Payer: Self-pay | Admitting: Internal Medicine

## 2021-07-21 VITALS — BP 140/78 | HR 67 | Ht 62.0 in | Wt 181.8 lb

## 2021-07-21 DIAGNOSIS — I10 Essential (primary) hypertension: Secondary | ICD-10-CM | POA: Diagnosis not present

## 2021-07-21 DIAGNOSIS — U071 COVID-19: Secondary | ICD-10-CM | POA: Diagnosis not present

## 2021-07-21 DIAGNOSIS — I25118 Atherosclerotic heart disease of native coronary artery with other forms of angina pectoris: Secondary | ICD-10-CM | POA: Diagnosis not present

## 2021-07-21 DIAGNOSIS — J1282 Pneumonia due to coronavirus disease 2019: Secondary | ICD-10-CM | POA: Diagnosis not present

## 2021-07-21 DIAGNOSIS — I5020 Unspecified systolic (congestive) heart failure: Secondary | ICD-10-CM

## 2021-07-21 MED ORDER — LOSARTAN POTASSIUM 100 MG PO TABS: 100.0000 mg | ORAL_TABLET | Freq: Every day | ORAL | 1 refills | Status: DC

## 2021-07-21 NOTE — Progress Notes (Signed)
? ? ?Date:  07/21/2021  ? ?Name:  Cassidy Dawson   DOB:  [birth year]   MRN:  [id number] ? ? ?Chief Complaint: Hospitalization Follow-up ?Hospital follow up.  Admitted to [facility] 07/10/21 to 07/13/21 for Covid-19 and NSTEMI. ?TOC call done on 07/14/21.  Hospital course, labs, imaging reviewed.  No follow up labs needed at this time.  Would recommend repeat CXR in one month. ? ?Discharge Diagnoses  ? ?COVID with Pneumonia and transient hypoxia ?CAD ?Chronic HFrEF ?Elevated Troponin  ?CAD (s/p PCI to LAD, 2018) ?HTN ?Hyperlipidemia ? ?Hospital Course  ? ?[name] is a 66 y.o. female admitted to [facility] on 07/10/2021 for COVID pneumonia. She has a PMHx of HTN, CAD (s/p PCI to LAD, 2018), NSTEMI, HFpEF (60-65%) presented to ED w/ 2 weeks of progressive SOB, cough and congestion after URI. In ED was COVID-19 positive. CXR read as Pulmonary congestion. Bibasilar opacities favored to represent atelectasis. Admitting team also suspected a post-viral PNA. She was started on IV remdesivir, Decadron, therapeutic enoxaparin, Rocephin/doxycycline and admitted to the medicine service for management. Please see H&P for admission details. She had mildly elevated troponin in the ED this was attributed to demand ischemia. Acute MI ruled out. She was quickly weaned off oxygen to room air within 24 hours of admission. On 07/10/2021 she was transferred to the advanced care at home service where she completed 3 days of remdesivir. She did not meet criteria for ongoing steroids. She is pending completed a 5-day course of ceftriaxone and doxycycline. Her blood pressure was suboptimally controlled with systolic over 735 on several occasions. We increased her carvedilol to 25 mg twice daily and encouraged her to follow-up with her PCP for continued outpatient blood pressure management. ? ?Procedures  ? ?None ?______________________________________________________________________ ?Discharge Medications: ? ?Medication  List  ? ?START taking these medications  ? cefuroxime 500 MG tablet; Commonly known as: CEFTIN; Take 1 tablet (500  ?mg total) by mouth Two (2) times a day for 4 doses. ? doxycycline 100 MG capsule; Commonly known as: VIBRAMYCIN; Take 1  ?capsule (100 mg total) by mouth Two (2) times a day for 4 doses. ? ?CHANGE how you take these medications  ? carvediloL 25 MG tablet; Commonly known as: COREG; Take 1 tablet (25 mg  ?total) by mouth Two (2) times a day.; What changed: medication strength,  ?how much to take, when to take this ? ?CONTINUE taking these medications  ? ascorbic acid (vitamin C) [place] ? aspirin 81 MG chewable tablet; CHEW 1 TABLET DAILY ? atorvastatin 80 MG tablet; Commonly known as: LIPITOR; TAKE 1 TABLET  ?($RemoveB'80MG'xbTdciuL$  TOTAL) BY MOUTH ONCE DAILY IN THE EVENING ? cetirizine 10 MG tablet; Commonly known as: ZyrTEC ? ergocalciferol (vitamin D2) 50 mcg (2,000 unit) Cap ? fluticasone propionate 50 mcg/actuation nasal spray; Commonly known as:  ?FLONASE; SPRAY 2 SPRAYS IN EACH NOSTRIL DAILY ? furosemide 40 MG tablet; Commonly known as: LASIX; Take 1 tablet (40 mg  ?total) by mouth daily. ? losartan 100 MG tablet; Commonly known as: COZAAR ? nitroglycerin 0.4 MG SL tablet; Commonly known as: NITROSTAT  ?Hypertension ?This is a chronic problem. The problem is uncontrolled. Past treatments include beta blockers (coreg dose increased).  ? ?Lab Results  ?Component Value Date  ? NA 143 01/14/2021  ? K 4.5 01/14/2021  ? CO2 27 01/14/2021  ? GLUCOSE 85 01/14/2021  ? BUN 13 01/14/2021  ? CREATININE 0.80 01/14/2021  ? CALCIUM 9.2 01/14/2021  ? EGFR 81 01/14/2021  ?  GFRNONAA 78 10/04/2019  ? ?Lab Results  ?Component Value Date  ? CHOL 176 10/08/2020  ? HDL 36 (L) 10/08/2020  ? LDLCALC 115 (H) 10/08/2020  ? TRIG 141 10/08/2020  ? CHOLHDL 4.9 (H) 10/08/2020  ? ?Lab Results  ?Component Value Date  ? TSH 1.730 10/08/2020  ? ?Lab Results  ?Component Value Date  ? HGBA1C 6.0 (H) 10/08/2020  ? ?Lab Results  ?Component Value Date  ?  WBC 7.5 10/08/2020  ? HGB 12.7 10/08/2020  ? HCT 40.3 10/08/2020  ? MCV 89 10/08/2020  ? PLT 387 10/08/2020  ? ?Lab Results  ?Component Value Date  ? ALT 11 10/08/2020  ? AST 14 10/08/2020  ? ALKPHOS 123 (H) 10/08/2020  ? BILITOT 0.5 10/08/2020  ? ?No results found for: 25OHVITD2, [place], VD25OH  ? ?Review of Systems ? ?Patient Active Problem List  ? Diagnosis Date Noted  ? Slow transit constipation 02/11/2021  ? Coronary artery disease of native artery of native heart with stable angina pectoris ([place]) 10/08/2020  ? Plantar wart 05/02/2018  ? Hypertriglyceridemia 05/01/2018  ? Pre-diabetes 05/01/2018  ? Systolic heart failure ([place]) 01/11/2017  ? History of non-ST elevation myocardial infarction (NSTEMI) 01/11/2017  ? Essential (primary) hypertension 08/14/2014  ? Allergic rhinitis, seasonal 08/14/2014  ? ? ?Allergies  ?Allergen Reactions  ? Ace Inhibitors Cough  ? Codeine Nausea And Vomiting  ? ? ?Past Surgical History:  ?Procedure Laterality Date  ? CORONARY ANGIOPLASTY WITH STENT PLACEMENT  01/2017  ? DES to LAD  ? [place]  ? ? ?Social History  ? ?Tobacco Use  ? Smoking status: Never  ? Smokeless tobacco: Never  ?Vaping Use  ? Vaping Use: Never used  ?Substance Use Topics  ? Alcohol use: Never  ?  Alcohol/week: 0.0 standard drinks  ? Drug use: Never  ? ? ? ?Medication list has been reviewed and updated. ? ?No outpatient medications have been marked as taking for the 07/21/21 encounter (Office Visit) with Glean Hess, MD.  ? ? ?PHQ 2/9 Scores 07/21/2021 07/06/2021 06/17/2021 04/09/2021  ?PHQ - 2 Score 0 0 0 0  ?PHQ- 9 Score 0 2 1 0  ? ? ?GAD 7 : Generalized Anxiety Score 07/21/2021 06/17/2021 04/09/2021 02/11/2021  ?Nervous, Anxious, on Edge 0 0 0 0  ?Control/stop worrying 0 0 0 0  ?Worry too much - different things 0 0 0 0  ?Trouble relaxing 0 0 0 0  ?Restless 0 0 0 0  ?Easily annoyed or irritable 0 0 0 0  ?Afraid - awful might happen 0 0 0 0  ?Total GAD 7 Score 0 0 0 0  ?Anxiety Difficulty  Not difficult at all Not difficult at all Not difficult at all -  ? ? ?BP Readings from Last 3 Encounters:  ?07/21/21 140/78  ?07/10/21 (!) 193/125  ?06/17/21 (!) 142/88  ? ? ?Physical Exam ?Vitals and nursing note reviewed.  ?Constitutional:   ?   General: She is not in acute distress. ?   Appearance: Normal appearance. She is well-developed.  ?   Comments: Fatigued appearing  ?HENT:  ?   Head: Normocephalic and atraumatic.  ?Cardiovascular:  ?   Rate and Rhythm: Normal rate and regular rhythm.  ?   Pulses: Normal pulses.  ?Pulmonary:  ?   Effort: Pulmonary effort is normal. No respiratory distress.  ?   Breath sounds: Transmitted upper airway sounds present. No wheezing or rhonchi.  ?Musculoskeletal:  ?   Cervical back:  Normal range of motion.  ?Lymphadenopathy:  ?   Cervical: No cervical adenopathy.  ?Skin: ?   General: Skin is warm and dry.  ?   Findings: No rash.  ?Neurological:  ?   Mental Status: She is alert and oriented to person, place, and time.  ?Psychiatric:     ?   Mood and Affect: Mood normal.     ?   Behavior: Behavior normal.  ? ? ?Wt Readings from Last 3 Encounters:  ?07/21/21 181 lb 12.8 oz (82.5 kg)  ?06/17/21 177 lb 9.6 oz (80.6 kg)  ?04/09/21 178 lb (80.7 kg)  ? ? ?BP 140/78   Pulse 67   Ht $R'5\' 2"'mi$  (1.575 m)   Wt 181 lb 12.8 oz (82.5 kg)   SpO2 93%   BMI 33.25 kg/m?  ? ?Assessment and Plan: ?1. Pneumonia due to COVID-19 virus ?She has completed antibiotics and is slowly recovering. ?She is still too winded to return to work so will give her a letter to be out until 08/04/21. ?Return in one month for follow up and CXR. ? ?2. Essential (primary) hypertension ?BP improved with increase in Coreg dose. ?- losartan (COZAAR) 100 MG tablet; Take 1 tablet (100 mg total) by mouth daily.  Dispense: 90 tablet; Refill: 1 ? ?3. Systolic heart failure, unspecified HF chronicity ([place]) ?Stable; follow up with Cardiology in May. ? ?4. Coronary artery disease of native artery of native heart with stable angina  pectoris ([place]) ?On aspirin and statin ? ? ?Partially dictated using Editor, commissioning. Any errors are unintentional. ? ?Halina Maidens, MD ?[facility] ?[facility] ? ?07/21/2021 ? ? ? ? ?

## 2021-07-23 ENCOUNTER — Ambulatory Visit: Admission: RE | Admit: 2021-07-23 | Payer: Medicare Other | Source: Home / Self Care | Admitting: Gastroenterology

## 2021-07-23 ENCOUNTER — Encounter: Admission: RE | Payer: Self-pay | Source: Home / Self Care

## 2021-07-23 SURGERY — COLONOSCOPY WITH PROPOFOL
Anesthesia: Choice

## 2021-07-27 ENCOUNTER — Other Ambulatory Visit: Payer: Self-pay | Admitting: Internal Medicine

## 2021-07-27 DIAGNOSIS — U071 COVID-19: Secondary | ICD-10-CM | POA: Diagnosis not present

## 2021-07-27 DIAGNOSIS — J3089 Other allergic rhinitis: Secondary | ICD-10-CM

## 2021-07-28 NOTE — Telephone Encounter (Signed)
Requested Prescriptions  ?Pending Prescriptions Disp Refills  ?? cetirizine (ZYRTEC) 10 MG tablet [Pharmacy Med Name: CETIRIZINE 10MG  TABLETS] 90 tablet 1  ?  Sig: TAKE 1 TABLET BY MOUTH DAILY  ?  ? Ear, Nose, and Throat:  Antihistamines 2 Passed - 07/27/2021  6:20 AM  ?  ?  Passed - Cr in normal range and within 360 days  ?  Creatinine, Ser  ?Date Value Ref Range Status  ?01/14/2021 0.80 0.57 - 1.00 mg/dL Final  ?   ?  ?  Passed - Valid encounter within last 12 months  ?  Recent Outpatient Visits   ?      ? 1 week ago Pneumonia due to COVID-19 virus  ? [facility], [place]  ? 1 month ago Acute otitis media, unspecified otitis media type  ? [facility], MD  ? 3 months ago Essential (primary) hypertension  ? [facility], MD  ? 5 months ago Essential (primary) hypertension  ? [facility], MD  ? 6 months ago Essential (primary) hypertension  ? [facility], MD  ?  ?  ?Future Appointments   ?        ? In 3 weeks Reubin Milan Judithann Graves, MD [facility], [facility]  ? In 2 months [facility] Judithann Graves, MD [facility], [facility]  ? In 2 months [facility] Judithann Graves, MD [facility], [facility]  ?  ? ?  ?  ?  ? ?

## 2021-08-13 ENCOUNTER — Ambulatory Visit: Payer: Self-pay | Admitting: *Deleted

## 2021-08-13 NOTE — Telephone Encounter (Signed)
Pt called back she refused an appointment she will call back if she wants to be seen. ? ?KP ?

## 2021-08-13 NOTE — Telephone Encounter (Signed)
Per agent: ? ?"Pt called in stating she has been having a low grade fever, nausea, and constipation, pt requested to speak with a nurse. Please advise." ? ?Chief Complaint: fever ?Symptoms: temp of 99.1 this AM 99.6 this afternoon. Reports constipation and nausea. LBM yesterday and today, "Small, not adequate"  Still with SOB from pneumonia no worsening. ?Frequency: This AM ?Pertinent Negatives: Patient denies abdominal pain, vomiting, cough "Mild, but not like before." No dysuria. ?Disposition: [] ED /[] Urgent Care (no appt availability in office) / [] Appointment(In office/virtual)/ []  [place] [facility]/ [] Home Care/ [] Refused Recommended Disposition /[] [place] [facility]/ [x]  Follow-up with PCP ?Additional Notes: Declined appt , would like recommendations on constipation. Home care advise provided on both constipation and fever and nausea per protocol. Pt verbalizes understanding. Advised to [facility] for any worsening symptoms. Assured pt NT would route to practice for PCPs review.  ? ? ? ?Reason for Disposition ? [1] Fever AND [2] no signs of serious infection or localizing symptoms (all other triage questions negative) ? ?Answer Assessment - Initial Assessment Questions ?1. TEMPERATURE: "What is the most recent temperature?"  "How was it measured?"  ?    99.6 presently ?2. ONSET: "When did the fever start?"  ?    99.1 this afternoon ?3. CHILLS: "Do you have chills?" If yes: "How bad are they?"  (e.g., none, mild, moderate, severe) ?  - NONE: no chills ?  - MILD: feeling cold ?  - MODERATE: feeling very cold, some shivering (feels better under a thick blanket) ?  - SEVERE: feeling extremely cold with shaking chills (general body shaking, rigors; even under a thick blanket)  ?    Chills ?4. OTHER SYMPTOMS: "Do you have any other symptoms besides the fever?"  (e.g., abdomen pain, cough, diarrhea, earache, headache, sore throat, urination pain) ?    Constipation, nausea. LBM yesterday and today, "Small  amount" ?5. CAUSE: If there are no symptoms, ask: "What do you think is causing the fever?"  ?    Unsure ?6. CONTACTS: "Does anyone else in the family have an infection?" ?     ?7. TREATMENT: "What have you done so far to treat this fever?" (e.g., medications) ?    No. Took Aleve yesterday ?8. IMMUNOCOMPROMISE: "Do you have of the following: diabetes, HIV positive, splenectomy, cancer chemotherapy, chronic steroid treatment, transplant patient, etc." ? ?Protocols used: Fever-A-AH ? ?

## 2021-08-13 NOTE — Telephone Encounter (Signed)
Noted  ? ?Pt declined appt. [facility] told pt what to do at home. ? ?KP ?

## 2021-08-13 NOTE — Telephone Encounter (Signed)
Called pt left VM to call back. Want to know if pt wanted to be seen.  ? ?[facility] nurse may give results to patient if they return call to clinic, a CRM has been created. ? ?KP

## 2021-08-17 ENCOUNTER — Ambulatory Visit: Payer: Medicare Other | Admitting: Internal Medicine

## 2021-08-17 DIAGNOSIS — I10 Essential (primary) hypertension: Secondary | ICD-10-CM | POA: Diagnosis not present

## 2021-08-17 DIAGNOSIS — I2510 Atherosclerotic heart disease of native coronary artery without angina pectoris: Secondary | ICD-10-CM | POA: Diagnosis not present

## 2021-08-17 DIAGNOSIS — Z885 Allergy status to narcotic agent status: Secondary | ICD-10-CM | POA: Diagnosis not present

## 2021-08-17 DIAGNOSIS — R0989 Other specified symptoms and signs involving the circulatory and respiratory systems: Secondary | ICD-10-CM | POA: Diagnosis not present

## 2021-08-17 DIAGNOSIS — R509 Fever, unspecified: Secondary | ICD-10-CM | POA: Diagnosis not present

## 2021-08-17 DIAGNOSIS — Z79899 Other long term (current) drug therapy: Secondary | ICD-10-CM | POA: Diagnosis not present

## 2021-08-17 DIAGNOSIS — E8770 Fluid overload, unspecified: Secondary | ICD-10-CM | POA: Diagnosis not present

## 2021-08-17 DIAGNOSIS — R600 Localized edema: Secondary | ICD-10-CM | POA: Diagnosis not present

## 2021-08-17 DIAGNOSIS — Z7982 Long term (current) use of aspirin: Secondary | ICD-10-CM | POA: Diagnosis not present

## 2021-08-17 DIAGNOSIS — M25471 Effusion, right ankle: Secondary | ICD-10-CM | POA: Diagnosis not present

## 2021-08-17 DIAGNOSIS — R059 Cough, unspecified: Secondary | ICD-10-CM | POA: Diagnosis not present

## 2021-08-17 DIAGNOSIS — M25472 Effusion, left ankle: Secondary | ICD-10-CM | POA: Diagnosis not present

## 2021-08-17 DIAGNOSIS — I509 Heart failure, unspecified: Secondary | ICD-10-CM | POA: Diagnosis not present

## 2021-08-17 DIAGNOSIS — R110 Nausea: Secondary | ICD-10-CM | POA: Diagnosis not present

## 2021-08-17 DIAGNOSIS — E785 Hyperlipidemia, unspecified: Secondary | ICD-10-CM | POA: Diagnosis not present

## 2021-08-17 DIAGNOSIS — Z20822 Contact with and (suspected) exposure to covid-19: Secondary | ICD-10-CM | POA: Diagnosis not present

## 2021-08-17 DIAGNOSIS — J302 Other seasonal allergic rhinitis: Secondary | ICD-10-CM | POA: Diagnosis not present

## 2021-08-18 DIAGNOSIS — R0989 Other specified symptoms and signs involving the circulatory and respiratory systems: Secondary | ICD-10-CM | POA: Diagnosis not present

## 2021-08-18 DIAGNOSIS — I509 Heart failure, unspecified: Secondary | ICD-10-CM | POA: Diagnosis not present

## 2021-08-19 DIAGNOSIS — E8770 Fluid overload, unspecified: Secondary | ICD-10-CM | POA: Diagnosis not present

## 2021-08-20 DIAGNOSIS — I5023 Acute on chronic systolic (congestive) heart failure: Secondary | ICD-10-CM | POA: Diagnosis not present

## 2021-08-20 DIAGNOSIS — Z79899 Other long term (current) drug therapy: Secondary | ICD-10-CM | POA: Diagnosis not present

## 2021-08-20 DIAGNOSIS — I2510 Atherosclerotic heart disease of native coronary artery without angina pectoris: Secondary | ICD-10-CM | POA: Diagnosis not present

## 2021-08-20 DIAGNOSIS — I110 Hypertensive heart disease with heart failure: Secondary | ICD-10-CM | POA: Diagnosis not present

## 2021-08-20 DIAGNOSIS — R944 Abnormal results of kidney function studies: Secondary | ICD-10-CM | POA: Diagnosis not present

## 2021-08-20 DIAGNOSIS — I5022 Chronic systolic (congestive) heart failure: Secondary | ICD-10-CM | POA: Diagnosis not present

## 2021-08-20 DIAGNOSIS — Z7982 Long term (current) use of aspirin: Secondary | ICD-10-CM | POA: Diagnosis not present

## 2021-08-20 DIAGNOSIS — E782 Mixed hyperlipidemia: Secondary | ICD-10-CM | POA: Diagnosis not present

## 2021-08-21 ENCOUNTER — Telehealth: Payer: Self-pay

## 2021-08-21 ENCOUNTER — Ambulatory Visit: Payer: Medicare Other | Admitting: Internal Medicine

## 2021-08-21 NOTE — Telephone Encounter (Signed)
Called pt she has an appt today to get a chest xray done. Pt went to ER yesterday 08/20/21 and had a chest xray done. Pt does not need to be seen today. Called to cancel appt. Pt also has a follow up appt with cardiologist next week. Please cancel appointment if patient calls back. ? ?PEC nurse may give results to patient if they return call to clinic, a CRM has been created. ? ?KP ?

## 2021-08-25 ENCOUNTER — Other Ambulatory Visit: Payer: Self-pay | Admitting: Internal Medicine

## 2021-08-25 DIAGNOSIS — I10 Essential (primary) hypertension: Secondary | ICD-10-CM

## 2021-08-27 DIAGNOSIS — I2510 Atherosclerotic heart disease of native coronary artery without angina pectoris: Secondary | ICD-10-CM | POA: Diagnosis not present

## 2021-08-27 DIAGNOSIS — I110 Hypertensive heart disease with heart failure: Secondary | ICD-10-CM | POA: Diagnosis not present

## 2021-08-27 DIAGNOSIS — Z79899 Other long term (current) drug therapy: Secondary | ICD-10-CM | POA: Diagnosis not present

## 2021-08-27 DIAGNOSIS — R944 Abnormal results of kidney function studies: Secondary | ICD-10-CM | POA: Diagnosis not present

## 2021-08-27 DIAGNOSIS — E782 Mixed hyperlipidemia: Secondary | ICD-10-CM | POA: Diagnosis not present

## 2021-08-27 DIAGNOSIS — Z7982 Long term (current) use of aspirin: Secondary | ICD-10-CM | POA: Diagnosis not present

## 2021-08-27 DIAGNOSIS — I5023 Acute on chronic systolic (congestive) heart failure: Secondary | ICD-10-CM | POA: Diagnosis not present

## 2021-08-27 NOTE — Telephone Encounter (Signed)
Requested medication (s) are due for refill today: no ? ?Requested medication (s) are on the active medication list: yes ? ?Last refill:  07/21/21 #90 1 RF ? ?Future visit scheduled: yes ? ?Notes to clinic:  overdue lab work  ? ? ?Requested Prescriptions  ?Pending Prescriptions Disp Refills  ? losartan (COZAAR) 100 MG tablet [Pharmacy Med Name: LOSARTAN 100MG  TABLETS] 90 tablet 1  ?  Sig: TAKE 1 TABLET(100 MG) BY MOUTH DAILY  ?  ? Cardiovascular:  Angiotensin Receptor Blockers Failed - 08/25/2021  1:58 PM  ?  ?  Failed - Cr in normal range and within 180 days  ?  Creatinine, Ser  ?Date Value Ref Range Status  ?01/14/2021 0.80 0.57 - 1.00 mg/dL Final  ?  ?  ?  ?  Failed - K in normal range and within 180 days  ?  Potassium  ?Date Value Ref Range Status  ?01/14/2021 4.5 3.5 - 5.2 mmol/L Final  ?  ?  ?  ?  Failed - Last BP in normal range  ?  BP Readings from Last 1 Encounters:  ?07/21/21 140/78  ?  ?  ?  ?  Passed - Patient is not pregnant  ?  ?  Passed - Valid encounter within last 6 months  ?  Recent Outpatient Visits   ? ?      ? 1 month ago Pneumonia due to COVID-19 virus  ? [facility], [place]  ? 2 months ago Acute otitis media, unspecified otitis media type  ? [facility], [place]  ? 4 months ago Essential (primary) hypertension  ? [facility], [place]  ? 6 months ago Essential (primary) hypertension  ? [facility], MD  ? 7 months ago Essential (primary) hypertension  ? [facility], [place]  ? ?  ?  ?Future Appointments   ? ?        ? In 1 month Reubin Milan Judithann Graves, MD [facility], [facility]  ? In 1 month [facility] Judithann Graves, MD [facility], [facility]  ? ?  ? ? ?  ?  ?  ? ? ? ? ?

## 2021-09-03 DIAGNOSIS — R944 Abnormal results of kidney function studies: Secondary | ICD-10-CM | POA: Diagnosis not present

## 2021-09-03 DIAGNOSIS — I5023 Acute on chronic systolic (congestive) heart failure: Secondary | ICD-10-CM | POA: Diagnosis not present

## 2021-09-14 DIAGNOSIS — I5023 Acute on chronic systolic (congestive) heart failure: Secondary | ICD-10-CM | POA: Diagnosis not present

## 2021-09-14 DIAGNOSIS — R799 Abnormal finding of blood chemistry, unspecified: Secondary | ICD-10-CM | POA: Diagnosis not present

## 2021-10-09 ENCOUNTER — Telehealth: Payer: Self-pay | Admitting: Internal Medicine

## 2021-10-09 ENCOUNTER — Ambulatory Visit: Payer: Medicare Other | Admitting: Internal Medicine

## 2021-10-09 NOTE — Telephone Encounter (Signed)
Please call the pt she needs to come in today for her appointment. Can't discuss any other problems during a CPE.  KP

## 2021-10-09 NOTE — Telephone Encounter (Signed)
Copied from CRM [id number]. Topic: Appointment Scheduling - Scheduling Inquiry for Clinic >> Oct 08, 2021  4:55 PM [place] B wrote: Reason for [id number] in to see if she can cancel her appt for tomorrow and have her diabetes checked along with her physical on June 13 or can she do physical tomorrow along with diabetes check up. Please call back

## 2021-10-13 ENCOUNTER — Encounter: Payer: Medicare Other | Admitting: Internal Medicine

## 2021-10-21 DIAGNOSIS — E785 Hyperlipidemia, unspecified: Secondary | ICD-10-CM | POA: Diagnosis not present

## 2021-10-21 DIAGNOSIS — I110 Hypertensive heart disease with heart failure: Secondary | ICD-10-CM | POA: Diagnosis not present

## 2021-10-21 DIAGNOSIS — I5023 Acute on chronic systolic (congestive) heart failure: Secondary | ICD-10-CM | POA: Diagnosis not present

## 2021-10-21 DIAGNOSIS — I2510 Atherosclerotic heart disease of native coronary artery without angina pectoris: Secondary | ICD-10-CM | POA: Diagnosis not present

## 2021-10-21 LAB — BASIC METABOLIC PANEL
BUN: 27 — AB (ref 4–21)
CO2: 29 — AB (ref 13–22)
Chloride: 106 (ref 99–108)
Creatinine: 1.3 — AB (ref 0.5–1.1)
Glucose: 112
Sodium: 141 (ref 137–147)

## 2021-10-21 LAB — COMPREHENSIVE METABOLIC PANEL
Calcium: 9.4 (ref 8.7–10.7)
eGFR: 46

## 2021-10-21 LAB — LIPID PANEL: LDL Cholesterol: 90

## 2021-10-22 DIAGNOSIS — I5023 Acute on chronic systolic (congestive) heart failure: Secondary | ICD-10-CM | POA: Diagnosis not present

## 2021-10-25 ENCOUNTER — Other Ambulatory Visit: Payer: Self-pay | Admitting: Internal Medicine

## 2021-10-25 DIAGNOSIS — I10 Essential (primary) hypertension: Secondary | ICD-10-CM

## 2021-11-06 ENCOUNTER — Ambulatory Visit (INDEPENDENT_AMBULATORY_CARE_PROVIDER_SITE_OTHER): Payer: Medicare Other | Admitting: Internal Medicine

## 2021-11-06 ENCOUNTER — Encounter: Payer: Self-pay | Admitting: Internal Medicine

## 2021-11-06 VITALS — BP 122/74 | HR 64 | Ht 62.0 in | Wt 176.0 lb

## 2021-11-06 DIAGNOSIS — R7303 Prediabetes: Secondary | ICD-10-CM

## 2021-11-06 DIAGNOSIS — N1831 Chronic kidney disease, stage 3a: Secondary | ICD-10-CM | POA: Diagnosis not present

## 2021-11-06 DIAGNOSIS — I10 Essential (primary) hypertension: Secondary | ICD-10-CM | POA: Diagnosis not present

## 2021-11-06 DIAGNOSIS — I5043 Acute on chronic combined systolic (congestive) and diastolic (congestive) heart failure: Secondary | ICD-10-CM

## 2021-11-06 LAB — POCT GLYCOSYLATED HEMOGLOBIN (HGB A1C): Hemoglobin A1C: 6.5 % — AB (ref 4.0–5.6)

## 2021-11-06 NOTE — Progress Notes (Signed)
Date:  11/06/2021   Name:  Cassidy Dawson   DOB:  1956/03/20   MRN:  [id number]   Chief Complaint: Diabetes  Hypertension This is a chronic problem. The problem is controlled. Pertinent negatives include no blurred vision, chest pain, headaches, palpitations or shortness of breath. Past treatments include angiotensin blockers, diuretics and beta blockers. Hypertensive end-organ damage includes kidney disease and CAD/MI (recent cath and started Entresto.).  Diabetes She presents for her follow-up diabetic visit. Diabetes type: prediabetes. There are no hypoglycemic associated symptoms. Pertinent negatives for hypoglycemia include no dizziness, headaches or tremors. Pertinent negatives for diabetes include no blurred vision, no chest pain, no fatigue, no foot paresthesias, no polydipsia, no polyuria, no visual change and no weight loss. Current diabetic treatment includes diet. She is compliant with treatment most of the time.    Lab Results  Component Value Date   NA 141 10/21/2021   K 4.5 01/14/2021   CO2 29 (A) 10/21/2021   GLUCOSE 85 01/14/2021   BUN 27 (A) 10/21/2021   CREATININE 1.3 (A) 10/21/2021   CALCIUM 9.4 10/21/2021   EGFR 46 10/21/2021   GFRNONAA 78 10/04/2019   Lab Results  Component Value Date   CHOL 176 10/08/2020   HDL 36 (L) 10/08/2020   LDLCALC 90 10/21/2021   TRIG 141 10/08/2020   CHOLHDL 4.9 (H) 10/08/2020   Lab Results  Component Value Date   TSH 1.730 10/08/2020   Lab Results  Component Value Date   HGBA1C 6.0 (H) 10/08/2020   Lab Results  Component Value Date   WBC 7.5 10/08/2020   HGB 12.7 10/08/2020   HCT 40.3 10/08/2020   MCV 89 10/08/2020   PLT 387 10/08/2020   Lab Results  Component Value Date   ALT 11 10/08/2020   AST 14 10/08/2020   ALKPHOS 123 (H) 10/08/2020   BILITOT 0.5 10/08/2020   No results found for: "25OHVITD2", "25OHVITD3", "VD25OH"   Review of Systems  Constitutional:  Negative for appetite change, chills,  fatigue, fever, unexpected weight change and weight loss.  HENT:  Negative for tinnitus and trouble swallowing.   Eyes:  Negative for blurred vision and visual disturbance.  Respiratory:  Negative for cough, chest tightness and shortness of breath.   Cardiovascular:  Negative for chest pain, palpitations and leg swelling.  Gastrointestinal:  Negative for abdominal pain.  Endocrine: Negative for polydipsia and polyuria.  Genitourinary:  Negative for dysuria and hematuria.  Musculoskeletal:  Negative for arthralgias.  Neurological:  Negative for dizziness, tremors, numbness and headaches.  Psychiatric/Behavioral:  Negative for dysphoric mood.     Patient Active Problem List   Diagnosis Date Noted   Chronic kidney disease, stage 3a ([place]) 11/06/2021   Slow transit constipation 02/11/2021   Coronary artery disease of native artery of native heart with stable angina pectoris ([place]) 10/08/2020   Plantar wart 05/02/2018   Hypertriglyceridemia 05/01/2018   Pre-diabetes 05/01/2018   Acute on chronic congestive heart failure ([place]) 01/11/2017   History of non-ST elevation myocardial infarction (NSTEMI) 01/11/2017   Essential (primary) hypertension 08/14/2014   Allergic rhinitis, seasonal 08/14/2014    Allergies  Allergen Reactions   Ace Inhibitors Cough   Codeine Nausea And Vomiting    Past Surgical History:  Procedure Laterality Date   CORONARY ANGIOPLASTY WITH STENT PLACEMENT  01/2017   DES to LAD   [place]    Social History   Tobacco Use   Smoking status: Never   Smokeless  tobacco: Never  Vaping Use   Vaping Use: Never used  Substance Use Topics   Alcohol use: Never    Alcohol/week: 0.0 standard drinks of alcohol   Drug use: Never     Medication list has been reviewed and updated.  Current Meds  Medication Sig   Ascorbic Acid (VITAMIN C PO) Take by mouth daily.   aspirin 81 MG chewable tablet Chew 81 mg by mouth daily.   carvedilol (COREG) 25 MG  tablet Take 25 mg by mouth 2 (two) times daily.   cetirizine (ZYRTEC) 10 MG tablet TAKE 1 TABLET BY MOUTH DAILY   Cyanocobalamin (VITAMIN B-12 PO) Take by mouth.   Ergocalciferol 50 MCG (2000 UT) CAPS Take 1 capsule by mouth daily.   fluticasone (FLONASE) 50 MCG/ACT nasal spray Place 2 sprays into both nostrils daily.   furosemide (LASIX) 40 MG tablet Take 40 mg by mouth daily.   nitroGLYCERIN (NITROSTAT) 0.4 MG SL tablet Place 1 tablet (0.4 mg total) under the tongue every 5 (five) minutes as needed for chest pain.   sacubitril-valsartan (ENTRESTO) 49-51 MG Take 1 tablet by mouth 2 (two) times daily.   [DISCONTINUED] losartan (COZAAR) 100 MG tablet TAKE 1 TABLET(100 MG) BY MOUTH DAILY       11/06/2021    3:17 PM 07/21/2021    2:04 PM 06/17/2021    1:43 PM 04/09/2021    9:28 AM  GAD 7 : Generalized Anxiety Score  Nervous, Anxious, on Edge 0 0 0 0  Control/stop worrying 0 0 0 0  Worry too much - different things 0 0 0 0  Trouble relaxing 0 0 0 0  Restless 0 0 0 0  Easily annoyed or irritable 0 0 0 0  Afraid - awful might happen 0 0 0 0  Total GAD 7 Score 0 0 0 0  Anxiety Difficulty Not difficult at all Not difficult at all Not difficult at all Not difficult at all       11/06/2021    3:17 PM 07/21/2021    2:04 PM 07/06/2021    9:25 AM  Depression screen PHQ 2/9  Decreased Interest 0 0 0  Down, Depressed, Hopeless 0 0 0  PHQ - 2 Score 0 0 0  Altered sleeping 0 0 1  Tired, decreased energy 0 0 1  Change in appetite 0 0 0  Feeling bad or failure about yourself  0 0 0  Trouble concentrating 0 0 0  Moving slowly or fidgety/restless 0 0 0  Suicidal thoughts 0 0 0  PHQ-9 Score 0 0 2  Difficult doing work/chores Not difficult at all Not difficult at all Not difficult at all    BP Readings from Last 3 Encounters:  11/06/21 122/74  07/21/21 140/78  07/10/21 (!) 193/125    Physical Exam Vitals and nursing note reviewed.  Constitutional:      General: She is not in acute  distress.    Appearance: Normal appearance. She is well-developed.  HENT:     Head: Normocephalic and atraumatic.  Neck:     Vascular: No carotid bruit.  Cardiovascular:     Rate and Rhythm: Normal rate and regular rhythm.  Pulmonary:     Effort: Pulmonary effort is normal. No respiratory distress.     Breath sounds: No wheezing or rhonchi.  Musculoskeletal:     Cervical back: Normal range of motion.     Right lower leg: No edema.     Left lower leg: No edema.  Lymphadenopathy:     Cervical: No cervical adenopathy.  Skin:    General: Skin is warm and dry.     Findings: No rash.  Neurological:     General: No focal deficit present.     Mental Status: She is alert and oriented to person, place, and time.  Psychiatric:        Mood and Affect: Mood normal.        Behavior: Behavior normal.     Wt Readings from Last 3 Encounters:  11/06/21 176 lb (79.8 kg)  07/21/21 181 lb 12.8 oz (82.5 kg)  06/17/21 177 lb 9.6 oz (80.6 kg)    BP 122/74 (BP Location: Right Arm, Cuff Size: Normal)   Pulse 64   Ht _0  (1.575 m)   Wt 176 lb (79.8 kg)   SpO2 94%   BMI 32.19 kg/m   Assessment and Plan: 1. Pre-diabetes Continue low carb diet and weight control efforts. Agree with starting Jardiance per Cardiology Will follow up in October at [facility] - POCT glycosylated hemoglobin (Hb A1C) = 6.5  2. Essential (primary) hypertension Clinically stable exam with well controlled BP on Entresto, Coreg and Lasix Tolerating medications without side effects at this time. Pt to continue current regimen and low sodium diet; benefits of regular exercise as able discussed.  3. Acute on chronic combined systolic and diastolic congestive heart failure ([place]) Now on Entresto and will likely start Jardiance after next Cardiology visit  4. Chronic kidney disease, stage 3a ([place]) Recommend recheck of labs after starting Jardiance   Partially dictated using Editor, commissioning. Any errors are  unintentional.  Halina Maidens, MD [facility]  11/06/2021

## 2021-11-09 DIAGNOSIS — I5022 Chronic systolic (congestive) heart failure: Secondary | ICD-10-CM | POA: Diagnosis not present

## 2022-02-10 ENCOUNTER — Encounter: Payer: Medicare Other | Admitting: Internal Medicine

## 2022-02-10 ENCOUNTER — Ambulatory Visit: Payer: Self-pay | Admitting: *Deleted

## 2022-02-10 NOTE — Progress Notes (Deleted)
Date:  02/10/2022   Name:  Cassidy Dawson   DOB:  March 22, 1956   MRN:  [id number]   Chief Complaint: No chief complaint on file. Cassidy Dawson is a 66 y.o. female who presents today for her Complete Annual Exam. She feels {DESC; WELL/FAIRLY WELL/POORLY:18703}. She reports exercising ***. She reports she is sleeping {DESC; WELL/FAIRLY WELL/POORLY:18703}. Breast complaints ***.  Mammogram: 08/2020 DEXA: 08/2020 Normal Pap smear: discontinued Colonoscopy: none  Health Maintenance Due  Topic Date Due   Zoster Vaccines- Shingrix (1 of 2) Never done   COLONOSCOPY (Pts 45-56yrs Insurance coverage will need to be confirmed)  Never done   COVID-19 Vaccine (4 - Janssen risk series) 08/07/2021   MAMMOGRAM  08/12/2021   INFLUENZA VACCINE  12/01/2021    Immunization History  Administered Date(s) Administered   Fluad Quad(high Dose 65+) 01/14/2021   Influenza,inj,Quad PF,6+ Mos 06/28/2016, 05/02/2018, 01/22/2019   Janssen (J&J) SARS-COV-2 Vaccination 07/14/2019   PFIZER(Purple Top)SARS-COV-2 Vaccination 02/01/2020, 06/12/2021   Pneumococcal Conjugate-13 10/04/2019   Pneumococcal Polysaccharide-23 10/08/2020   Tdap 06/12/2021    Hypertension This is a chronic problem. The problem is controlled. Pertinent negatives include no chest pain, headaches, palpitations or shortness of breath. Past treatments include angiotensin blockers, diuretics and beta blockers.  Hyperlipidemia This is a chronic problem. The problem is uncontrolled. Pertinent negatives include no chest pain or shortness of breath. Current antihyperlipidemic treatment includes statins (lipitor 80 mg). The current treatment provides moderate (LDL 90) improvement of lipids.  Diabetes She presents for her follow-up diabetic visit. She has type 2 diabetes mellitus. Her disease course has been stable. Pertinent negatives for hypoglycemia include no dizziness, headaches, nervousness/anxiousness or tremors. Pertinent  negatives for diabetes include no chest pain, no fatigue, no polydipsia and no polyuria. Current diabetic treatments: jardiance started.  CAD - chronic systolic heart failure [facility] by [facility]. Recently started Jardiance 10 mg. She previously had LAD myocardial infarction in 2018 with drug-eluting stent placement at that time. She has done well and does not have any recurrent angina however concerned for progression or in-stent restenosis in the setting of recently reduced ejection fraction.  Lab Results  Component Value Date   NA 141 10/21/2021   K 4.5 01/14/2021   CO2 29 (A) 10/21/2021   GLUCOSE 85 01/14/2021   BUN 27 (A) 10/21/2021   CREATININE 1.3 (A) 10/21/2021   CALCIUM 9.4 10/21/2021   EGFR 46 10/21/2021   GFRNONAA 78 10/04/2019   Lab Results  Component Value Date   CHOL 176 10/08/2020   HDL 36 (L) 10/08/2020   LDLCALC 90 10/21/2021   TRIG 141 10/08/2020   CHOLHDL 4.9 (H) 10/08/2020   Lab Results  Component Value Date   TSH 1.730 10/08/2020   Lab Results  Component Value Date   HGBA1C 6.5 (A) 11/06/2021   Lab Results  Component Value Date   WBC 7.5 10/08/2020   HGB 12.7 10/08/2020   HCT 40.3 10/08/2020   MCV 89 10/08/2020   PLT 387 10/08/2020   Lab Results  Component Value Date   ALT 11 10/08/2020   AST 14 10/08/2020   ALKPHOS 123 (H) 10/08/2020   BILITOT 0.5 10/08/2020   No results found for: "25OHVITD2", "25OHVITD3", "VD25OH"   Review of Systems  Constitutional:  Negative for chills, fatigue and fever.  HENT:  Negative for congestion, hearing loss, tinnitus, trouble swallowing and voice change.   Eyes:  Negative for visual disturbance.  Respiratory:  Negative for cough, chest tightness, shortness  of breath and wheezing.   Cardiovascular:  Negative for chest pain, palpitations and leg swelling.  Gastrointestinal:  Negative for abdominal pain, constipation, diarrhea and vomiting.  Endocrine: Negative for polydipsia and polyuria.  Genitourinary:   Negative for dysuria, frequency, genital sores, vaginal bleeding and vaginal discharge.  Musculoskeletal:  Negative for arthralgias, gait problem and joint swelling.  Skin:  Negative for color change and rash.  Neurological:  Negative for dizziness, tremors, light-headedness and headaches.  Hematological:  Negative for adenopathy. Does not bruise/bleed easily.  Psychiatric/Behavioral:  Negative for dysphoric mood and sleep disturbance. The patient is not nervous/anxious.     Patient Active Problem List   Diagnosis Date Noted   Chronic kidney disease, stage 3a ([place]) 11/06/2021   Slow transit constipation 02/11/2021   Coronary artery disease of native artery of native heart with stable angina pectoris ([place]) 10/08/2020   Plantar wart 05/02/2018   Hypertriglyceridemia 05/01/2018   Pre-diabetes 05/01/2018   Acute on chronic congestive heart failure ([place]) 01/11/2017   History of non-ST elevation myocardial infarction (NSTEMI) 01/11/2017   Essential (primary) hypertension 08/14/2014   Allergic rhinitis, seasonal 08/14/2014    Allergies  Allergen Reactions   Ace Inhibitors Cough   Codeine Nausea And Vomiting    Past Surgical History:  Procedure Laterality Date   CORONARY ANGIOPLASTY WITH STENT PLACEMENT  01/2017   DES to LAD   [place]    Social History   Tobacco Use   Smoking status: Never   Smokeless tobacco: Never  Vaping Use   Vaping Use: Never used  Substance Use Topics   Alcohol use: Never    Alcohol/week: 0.0 standard drinks of alcohol   Drug use: Never     Medication list has been reviewed and updated.  No outpatient medications have been marked as taking for the 02/10/22 encounter (Appointment) with Glean Hess, MD.       11/06/2021    3:17 PM 07/21/2021    2:04 PM 06/17/2021    1:43 PM 04/09/2021    9:28 AM  GAD 7 : Generalized Anxiety Score  Nervous, Anxious, on Edge 0 0 0 0  Control/stop worrying 0 0 0 0  Worry too much -  different things 0 0 0 0  Trouble relaxing 0 0 0 0  Restless 0 0 0 0  Easily annoyed or irritable 0 0 0 0  Afraid - awful might happen 0 0 0 0  Total GAD 7 Score 0 0 0 0  Anxiety Difficulty Not difficult at all Not difficult at all Not difficult at all Not difficult at all       11/06/2021    3:17 PM 07/21/2021    2:04 PM 07/06/2021    9:25 AM  Depression screen PHQ 2/9  Decreased Interest 0 0 0  Down, Depressed, Hopeless 0 0 0  PHQ - 2 Score 0 0 0  Altered sleeping 0 0 1  Tired, decreased energy 0 0 1  Change in appetite 0 0 0  Feeling bad or failure about yourself  0 0 0  Trouble concentrating 0 0 0  Moving slowly or fidgety/restless 0 0 0  Suicidal thoughts 0 0 0  PHQ-9 Score 0 0 2  Difficult doing work/chores Not difficult at all Not difficult at all Not difficult at all    BP Readings from Last 3 Encounters:  11/06/21 122/74  07/21/21 140/78  07/10/21 (!) 193/125    Physical Exam Vitals and nursing note  reviewed.  Constitutional:      General: She is not in acute distress.    Appearance: She is well-developed.  HENT:     Head: Normocephalic and atraumatic.     Right Ear: Tympanic membrane and ear canal normal.     Left Ear: Tympanic membrane and ear canal normal.     Nose:     Right Sinus: No maxillary sinus tenderness.     Left Sinus: No maxillary sinus tenderness.  Eyes:     General: No scleral icterus.       Right eye: No discharge.        Left eye: No discharge.     Conjunctiva/sclera: Conjunctivae normal.  Neck:     Thyroid: No thyromegaly.     Vascular: No carotid bruit.  Cardiovascular:     Rate and Rhythm: Normal rate and regular rhythm.     Pulses: Normal pulses.     Heart sounds: Normal heart sounds.  Pulmonary:     Effort: Pulmonary effort is normal. No respiratory distress.     Breath sounds: No wheezing.  Chest:  Breasts:    Right: No mass, nipple discharge, skin change or tenderness.     Left: No mass, nipple discharge, skin change or  tenderness.  Abdominal:     General: Bowel sounds are normal.     Palpations: Abdomen is soft.     Tenderness: There is no abdominal tenderness.  Musculoskeletal:     Cervical back: Normal range of motion. No erythema.     Right lower leg: No edema.     Left lower leg: No edema.  Lymphadenopathy:     Cervical: No cervical adenopathy.  Skin:    General: Skin is warm and dry.     Findings: No rash.  Neurological:     Mental Status: She is alert and oriented to person, place, and time.     Cranial Nerves: No cranial nerve deficit.     Sensory: No sensory deficit.     Deep Tendon Reflexes: Reflexes are normal and symmetric.  Psychiatric:        Attention and Perception: Attention normal.        Mood and Affect: Mood normal.     Wt Readings from Last 3 Encounters:  11/06/21 176 lb (79.8 kg)  07/21/21 181 lb 12.8 oz (82.5 kg)  06/17/21 177 lb 9.6 oz (80.6 kg)    There were no vitals taken for this visit.  Assessment and Plan:

## 2022-02-10 NOTE — Telephone Encounter (Signed)
Third attempt to contact patient- left message to call office °

## 2022-02-10 NOTE — Telephone Encounter (Signed)
Summary: fever and diarrhea   Pt called to reschedule her CPE for today due to having a slight fever and diarrhea / please advise     Attempted to call patient regarding her symptoms- left message to call office.

## 2022-02-10 NOTE — Telephone Encounter (Signed)
Second attempt to contact patient- left message to call office 

## 2022-02-24 ENCOUNTER — Encounter: Payer: Self-pay | Admitting: Internal Medicine

## 2022-02-24 ENCOUNTER — Ambulatory Visit (INDEPENDENT_AMBULATORY_CARE_PROVIDER_SITE_OTHER): Payer: Medicare Other | Admitting: Internal Medicine

## 2022-02-24 VITALS — BP 118/72 | HR 63 | Ht 62.0 in | Wt 174.0 lb

## 2022-02-24 DIAGNOSIS — Z1211 Encounter for screening for malignant neoplasm of colon: Secondary | ICD-10-CM

## 2022-02-24 DIAGNOSIS — N1831 Chronic kidney disease, stage 3a: Secondary | ICD-10-CM | POA: Diagnosis not present

## 2022-02-24 DIAGNOSIS — R7303 Prediabetes: Secondary | ICD-10-CM

## 2022-02-24 DIAGNOSIS — E781 Pure hyperglyceridemia: Secondary | ICD-10-CM | POA: Diagnosis not present

## 2022-02-24 DIAGNOSIS — I10 Essential (primary) hypertension: Secondary | ICD-10-CM | POA: Diagnosis not present

## 2022-02-24 DIAGNOSIS — Z0000 Encounter for general adult medical examination without abnormal findings: Secondary | ICD-10-CM

## 2022-02-24 DIAGNOSIS — I25118 Atherosclerotic heart disease of native coronary artery with other forms of angina pectoris: Secondary | ICD-10-CM

## 2022-02-24 DIAGNOSIS — Z23 Encounter for immunization: Secondary | ICD-10-CM | POA: Diagnosis not present

## 2022-02-24 DIAGNOSIS — Z1231 Encounter for screening mammogram for malignant neoplasm of breast: Secondary | ICD-10-CM | POA: Diagnosis not present

## 2022-02-24 DIAGNOSIS — J3089 Other allergic rhinitis: Secondary | ICD-10-CM

## 2022-02-24 MED ORDER — ATORVASTATIN CALCIUM 80 MG PO TABS: 80.0000 mg | ORAL_TABLET | Freq: Every day | ORAL | 3 refills | Status: DC

## 2022-02-24 MED ORDER — CETIRIZINE HCL 10 MG PO TABS: 10.0000 mg | ORAL_TABLET | Freq: Every day | ORAL | 3 refills | Status: AC

## 2022-02-24 NOTE — Patient Instructions (Signed)
Call [facility] to schedule your mammogram at [phone].  

## 2022-02-24 NOTE — Progress Notes (Signed)
Date:  02/24/2022   Name:  Porshea Janowski   DOB:  [birth year]   MRN:  [id number]   Chief Complaint: Annual Exam (Breast exam no pap) Cassidy Dawson is a 66 y.o. female who presents today for her Complete Annual Exam. She feels well. She reports exercising walking 2-3 days a week. She reports she is sleeping well. Breast complaints none.  Mammogram: 08/2020 DEXA: 08/2020 Normal Colonoscopy: none  Health Maintenance Due  Topic Date Due   Zoster Vaccines- Shingrix (1 of 2) Never done   COLONOSCOPY (Pts 45-40yr Insurance coverage will need to be confirmed)  Never done   COVID-19 Vaccine (4 - Janssen risk series) 08/07/2021   MAMMOGRAM  08/12/2021   INFLUENZA VACCINE  12/01/2021    Immunization History  Administered Date(s) Administered   Fluad Quad(high Dose 65+) 01/14/2021   Influenza,inj,Quad PF,6+ Mos 06/28/2016, 05/02/2018, 01/22/2019   Janssen (J&J) SARS-COV-2 Vaccination 07/14/2019   PFIZER(Purple Top)SARS-COV-2 Vaccination 02/01/2020, 06/12/2021   Pneumococcal Conjugate-13 10/04/2019   Pneumococcal Polysaccharide-23 10/08/2020   Tdap 06/12/2021   CAD - on Entresto and Jardiance for HFrEF.  Followed closely by cardiology.  ECHO repeat planned for November.  She feels well without CP or shortness of breath. Hypertension This is a chronic problem. The problem is controlled. Pertinent negatives include no chest pain, headaches, palpitations or shortness of breath. Past treatments include diuretics, angiotensin blockers and beta blockers.  Hyperlipidemia This is a chronic problem. Recent lipid tests were reviewed and are high (trigs). Pertinent negatives include no chest pain or shortness of breath. Current antihyperlipidemic treatment includes statins.  Diabetes She presents for her follow-up diabetic visit. Diabetes type: prediabetes. Her disease course has been stable. Pertinent negatives for hypoglycemia include no dizziness, headaches,  nervousness/anxiousness or tremors. Pertinent negatives for diabetes include no chest pain, no fatigue, no polydipsia and no polyuria. Current diabetic treatments: jardiance.    Lab Results  Component Value Date   NA 141 10/21/2021   K 4.5 01/14/2021   CO2 29 (A) 10/21/2021   GLUCOSE 85 01/14/2021   BUN 27 (A) 10/21/2021   CREATININE 1.3 (A) 10/21/2021   CALCIUM 9.4 10/21/2021   EGFR 46 10/21/2021   GFRNONAA 78 10/04/2019   Lab Results  Component Value Date   CHOL 176 10/08/2020   HDL 36 (L) 10/08/2020   LDLCALC 90 10/21/2021   TRIG 141 10/08/2020   CHOLHDL 4.9 (H) 10/08/2020   Lab Results  Component Value Date   TSH 1.730 10/08/2020   Lab Results  Component Value Date   HGBA1C 6.5 (A) 11/06/2021   Lab Results  Component Value Date   WBC 7.5 10/08/2020   HGB 12.7 10/08/2020   HCT 40.3 10/08/2020   MCV 89 10/08/2020   PLT 387 10/08/2020   Lab Results  Component Value Date   ALT 11 10/08/2020   AST 14 10/08/2020   ALKPHOS 123 (H) 10/08/2020   BILITOT 0.5 10/08/2020   No results found for: "25OHVITD2", "25OHVITD3", "VD25OH"   Review of Systems  Constitutional:  Negative for chills, fatigue and fever.  HENT:  Negative for congestion, hearing loss, tinnitus, trouble swallowing and voice change.   Eyes:  Negative for visual disturbance.  Respiratory:  Negative for cough, chest tightness, shortness of breath and wheezing.   Cardiovascular:  Negative for chest pain, palpitations and leg swelling.  Gastrointestinal:  Negative for abdominal pain, constipation, diarrhea and vomiting.  Endocrine: Negative for polydipsia and polyuria.  Genitourinary:  Negative for dysuria, frequency,  genital sores, vaginal bleeding and vaginal discharge.  Musculoskeletal:  Negative for arthralgias, gait problem and joint swelling.  Skin:  Negative for color change and rash.  Neurological:  Negative for dizziness, tremors, light-headedness and headaches.  Hematological:  Negative for  adenopathy. Does not bruise/bleed easily.  Psychiatric/Behavioral:  Negative for dysphoric mood and sleep disturbance. The patient is not nervous/anxious.     Patient Active Problem List   Diagnosis Date Noted   Chronic kidney disease, stage 3a ([place]) 11/06/2021   Slow transit constipation 02/11/2021   Coronary artery disease of native artery of native heart with stable angina pectoris ([place]) 10/08/2020   Plantar wart 05/02/2018   Hypertriglyceridemia 05/01/2018   Pre-diabetes 05/01/2018   Acute on chronic congestive heart failure ([place]) 01/11/2017   History of non-ST elevation myocardial infarction (NSTEMI) 01/11/2017   Essential (primary) hypertension 08/14/2014   Allergic rhinitis, seasonal 08/14/2014    Allergies  Allergen Reactions   Ace Inhibitors Cough   Codeine Nausea And Vomiting    Past Surgical History:  Procedure Laterality Date   CORONARY ANGIOPLASTY WITH STENT PLACEMENT  01/2017   DES to LAD   [place]    Social History   Tobacco Use   Smoking status: Never   Smokeless tobacco: Never  Vaping Use   Vaping Use: Never used  Substance Use Topics   Alcohol use: Never    Alcohol/week: 0.0 standard drinks of alcohol   Drug use: Never     Medication list has been reviewed and updated.  Current Meds  Medication Sig   Ascorbic Acid (VITAMIN C PO) Take by mouth daily.   aspirin 81 MG chewable tablet Chew 81 mg by mouth daily.   atorvastatin (LIPITOR) 80 MG tablet Take 1 tablet (80 mg total) by mouth daily.   carvedilol (COREG) 25 MG tablet Take 25 mg by mouth 2 (two) times daily.   cetirizine (ZYRTEC) 10 MG tablet TAKE 1 TABLET BY MOUTH DAILY   Cyanocobalamin (VITAMIN B-12 PO) Take by mouth.   Ergocalciferol 50 MCG (2000 UT) CAPS Take 1 capsule by mouth daily.   fluticasone (FLONASE) 50 MCG/ACT nasal spray Place 2 sprays into both nostrils daily.   furosemide (LASIX) 40 MG tablet Take 40 mg by mouth daily.   JARDIANCE 10 MG TABS tablet  Take 10 mg by mouth daily.   nitroGLYCERIN (NITROSTAT) 0.4 MG SL tablet Place 1 tablet (0.4 mg total) under the tongue every 5 (five) minutes as needed for chest pain.   sacubitril-valsartan (ENTRESTO) 49-51 MG Take 1 tablet by mouth 2 (two) times daily.   spironolactone (ALDACTONE) 25 MG tablet Take 25 mg by mouth daily.       02/24/2022   11:06 AM 11/06/2021    3:17 PM 07/21/2021    2:04 PM 06/17/2021    1:43 PM  GAD 7 : Generalized Anxiety Score  Nervous, Anxious, on Edge 0 0 0 0  Control/stop worrying 0 0 0 0  Worry too much - different things 0 0 0 0  Trouble relaxing 0 0 0 0  Restless 0 0 0 0  Easily annoyed or irritable 0 0 0 0  Afraid - awful might happen 0 0 0 0  Total GAD 7 Score 0 0 0 0  Anxiety Difficulty Not difficult at all Not difficult at all Not difficult at all Not difficult at all       02/24/2022   11:06 AM 11/06/2021    3:17 PM 07/21/2021  2:04 PM  Depression screen PHQ 2/9  Decreased Interest 0 0 0  Down, Depressed, Hopeless 0 0 0  PHQ - 2 Score 0 0 0  Altered sleeping 0 0 0  Tired, decreased energy 0 0 0  Change in appetite 0 0 0  Feeling bad or failure about yourself  0 0 0  Trouble concentrating 0 0 0  Moving slowly or fidgety/restless 0 0 0  Suicidal thoughts 0 0 0  PHQ-9 Score 0 0 0  Difficult doing work/chores Not difficult at all Not difficult at all Not difficult at all    BP Readings from Last 3 Encounters:  02/24/22 118/72  11/06/21 122/74  07/21/21 140/78    Physical Exam Vitals and nursing note reviewed.  Constitutional:      General: She is not in acute distress.    Appearance: She is well-developed.  HENT:     Head: Normocephalic and atraumatic.     Right Ear: Tympanic membrane and ear canal normal.     Left Ear: Tympanic membrane and ear canal normal.     Nose:     Right Sinus: No maxillary sinus tenderness.     Left Sinus: No maxillary sinus tenderness.  Eyes:     General: No scleral icterus.       Right eye: No  discharge.        Left eye: No discharge.     Conjunctiva/sclera: Conjunctivae normal.  Neck:     Thyroid: No thyromegaly.     Vascular: No carotid bruit.  Cardiovascular:     Rate and Rhythm: Normal rate and regular rhythm.     Pulses: Normal pulses.     Heart sounds: Normal heart sounds.  Pulmonary:     Effort: Pulmonary effort is normal. No respiratory distress.     Breath sounds: No wheezing.  Chest:  Breasts:    Right: No mass, nipple discharge, skin change or tenderness.     Left: No mass, nipple discharge, skin change or tenderness.  Abdominal:     General: Bowel sounds are normal.     Palpations: Abdomen is soft.     Tenderness: There is no abdominal tenderness.  Musculoskeletal:     Cervical back: Normal range of motion. No erythema.     Right lower leg: No edema.     Left lower leg: No edema.  Lymphadenopathy:     Cervical: No cervical adenopathy.  Skin:    General: Skin is warm and dry.     Findings: No rash.  Neurological:     Mental Status: She is alert and oriented to person, place, and time.     Cranial Nerves: No cranial nerve deficit.     Sensory: No sensory deficit.     Deep Tendon Reflexes: Reflexes are normal and symmetric.  Psychiatric:        Attention and Perception: Attention normal.        Mood and Affect: Mood normal.     Wt Readings from Last 3 Encounters:  02/24/22 174 lb (78.9 kg)  11/06/21 176 lb (79.8 kg)  07/21/21 181 lb 12.8 oz (82.5 kg)    BP 118/72   Pulse 63   Ht _0  (1.575 m)   Wt 174 lb (78.9 kg)   SpO2 96%   BMI 31.83 kg/m   Assessment and Plan: 1. Annual physical exam Normal exam. Flu and Covid vaccines today  2. Encounter for screening mammogram for breast cancer Schedule Mammogram at [facility]  3. Colon cancer screening Refer for colonoscopy - Ambulatory referral to Gastroenterology  4. Essential (primary) hypertension Clinically stable exam with well controlled BP. Tolerating medications without side effects  at this time. Pt to continue current regimen and low sodium diet; benefits of regular exercise as able discussed. - CBC with Differential/Platelet - TSH  5. Coronary artery disease of native artery of native heart with stable angina pectoris (HCC) Stable HFrEF - now on Entresto and Jardiance Will check lipids and renal function If she develops more vaginal itching, call for topical antifungal - Lipid panel - Direct LDL  6. Chronic kidney disease, stage 3a (HCC) Monitoring  - Comprehensive metabolic panel  7. Hypertriglyceridemia Continue high dose atorvastain - Lipid panel - Direct LDL - atorvastatin (LIPITOR) 80 MG tablet; Take 1 tablet (80 mg total) by mouth daily.  Dispense: 90 tablet; Refill: 3  8. Pre-diabetes Check labs and advise Expect improvement since starting Jardiance - Hemoglobin A1c - Microalbumin / creatinine urine ratio  9. Environmental and seasonal allergies - cetirizine (ZYRTEC) 10 MG tablet; Take 1 tablet (10 mg total) by mouth daily.  Dispense: 90 tablet; Refill: 3  10. Need for immunization against influenza - Flu Vaccine QUAD High Dose(Fluad)   Partially dictated using Editor, commissioning. Any errors are unintentional.  Halina Maidens, MD [facility]  02/24/2022

## 2022-02-25 ENCOUNTER — Other Ambulatory Visit: Payer: Self-pay

## 2022-02-25 ENCOUNTER — Telehealth: Payer: Self-pay

## 2022-02-25 DIAGNOSIS — Z1211 Encounter for screening for malignant neoplasm of colon: Secondary | ICD-10-CM

## 2022-02-25 MED ORDER — NA SULFATE-K SULFATE-MG SULF 17.5-3.13-1.6 GM/177ML PO SOLN: 1.0000 | Freq: Once | ORAL | 0 refills | Status: AC

## 2022-02-25 NOTE — Telephone Encounter (Signed)
Gastroenterology Pre-Procedure Review  Request Date: 03/17/22 Requesting Physician: Dr. Vicente Males  PATIENT REVIEW QUESTIONS: The patient responded to the following health history questions as indicated:    1. Are you having any GI issues? no 2. Do you have a personal history of Polyps? no 3. Do you have a family history of Colon Cancer or Polyps? no 4. Diabetes Mellitus? no however takes Cassidy Dawson has been advised to hold med 3 days prior to colonoscopy 5. Joint replacements in the past 12 months?no 6. Major health problems in the past 3 months?no 7. Any artificial heart valves, MVP, or defibrillator? No however patient has CAD, Heart Failure   [facility] Cardiology Dr. Bernell List clearance has been faxed  [place]:    Patient reports the following regarding taking any anticoagulation/antiplatelet therapy:   Plavix, Coumadin, Eliquis, Xarelto, Lovenox, Pradaxa, Brilinta, or Effient? no Aspirin? yes (81 mg )  Patient confirms/reports the following medications:  Current Outpatient Medications  Medication Sig Dispense Refill   Ascorbic Acid (VITAMIN C PO) Take by mouth daily.     aspirin 81 MG chewable tablet Chew 81 mg by mouth daily.     atorvastatin (LIPITOR) 80 MG tablet Take 1 tablet (80 mg total) by mouth daily. 90 tablet 3   carvedilol (COREG) 25 MG tablet Take 25 mg by mouth 2 (two) times daily.     cetirizine (ZYRTEC) 10 MG tablet Take 1 tablet (10 mg total) by mouth daily. 90 tablet 3   Cyanocobalamin (VITAMIN B-12 PO) Take by mouth.     Ergocalciferol 50 MCG (2000 UT) CAPS Take 1 capsule by mouth daily.     fluticasone (FLONASE) 50 MCG/ACT nasal spray Place 2 sprays into both nostrils daily. 48 g 1   furosemide (LASIX) 40 MG tablet Take 40 mg by mouth daily.     JARDIANCE 10 MG TABS tablet Take 10 mg by mouth daily.     nitroGLYCERIN (NITROSTAT) 0.4 MG SL tablet Place 1 tablet (0.4 mg total) under the tongue every 5 (five) minutes as needed for chest pain. 25 tablet 1    sacubitril-valsartan (ENTRESTO) 49-51 MG Take 1 tablet by mouth 2 (two) times daily.     spironolactone (ALDACTONE) 25 MG tablet Take 25 mg by mouth daily.     No current facility-administered medications for this visit.    Patient confirms/reports the following allergies:  Allergies  Allergen Reactions   Ace Inhibitors Cough   Codeine Nausea And Vomiting    No orders of the defined types were placed in this encounter.   AUTHORIZATION INFORMATION Primary Insurance: 1D#: Group #:  Secondary Insurance: 1D#: Group #:  SCHEDULE INFORMATION: Date:  Time: Location:

## 2022-02-26 LAB — LIPID PANEL
Chol/HDL Ratio: 4.6 ratio — ABNORMAL HIGH (ref 0.0–4.4)
Cholesterol, Total: 164 mg/dL (ref 100–199)
HDL: 36 mg/dL — ABNORMAL LOW (ref >39)
LDL Chol Calc (NIH): 101 mg/dL — ABNORMAL HIGH (ref 0–99)
Triglycerides: 155 mg/dL — ABNORMAL HIGH (ref 0–149)
VLDL Cholesterol Cal: 27 mg/dL (ref 5–40)

## 2022-02-26 LAB — CBC WITH DIFFERENTIAL/PLATELET
Basophils Absolute: 0.1 10*3/uL (ref 0.0–0.2)
Basos: 1 %
EOS (ABSOLUTE): 0.1 10*3/uL (ref 0.0–0.4)
Eos: 2 %
Hematocrit: 40.6 % (ref 34.0–46.6)
Hemoglobin: 13.4 g/dL (ref 11.1–15.9)
Immature Grans (Abs): 0 10*3/uL (ref 0.0–0.1)
Immature Granulocytes: 0 %
Lymphocytes Absolute: 2.2 10*3/uL (ref 0.7–3.1)
Lymphs: 31 %
MCH: 30.2 pg (ref 26.6–33.0)
MCHC: 33 g/dL (ref 31.5–35.7)
MCV: 92 fL (ref 79–97)
Monocytes Absolute: 0.5 10*3/uL (ref 0.1–0.9)
Monocytes: 7 %
Neutrophils Absolute: 4.1 10*3/uL (ref 1.4–7.0)
Neutrophils: 59 %
Platelets: 403 10*3/uL (ref 150–450)
RBC: 4.43 x10E6/uL (ref 3.77–5.28)
RDW: 12.2 % (ref 11.7–15.4)
WBC: 7.1 10*3/uL (ref 3.4–10.8)

## 2022-02-26 LAB — COMPREHENSIVE METABOLIC PANEL
ALT: 16 IU/L (ref 0–32)
AST: 16 IU/L (ref 0–40)
Albumin/Globulin Ratio: 1.5 (ref 1.2–2.2)
Albumin: 4.6 g/dL (ref 3.9–4.9)
Alkaline Phosphatase: 124 IU/L — ABNORMAL HIGH (ref 44–121)
BUN/Creatinine Ratio: 18 (ref 12–28)
BUN: 25 mg/dL (ref 8–27)
Bilirubin Total: 0.5 mg/dL (ref 0.0–1.2)
CO2: 26 mmol/L (ref 20–29)
Calcium: 9.9 mg/dL (ref 8.7–10.3)
Chloride: 102 mmol/L (ref 96–106)
Creatinine, Ser: 1.38 mg/dL — ABNORMAL HIGH (ref 0.57–1.00)
Globulin, Total: 3.1 g/dL (ref 1.5–4.5)
Glucose: 95 mg/dL (ref 70–99)
Potassium: 5 mmol/L (ref 3.5–5.2)
Sodium: 142 mmol/L (ref 134–144)
Total Protein: 7.7 g/dL (ref 6.0–8.5)
eGFR: 42 mL/min/{1.73_m2} — ABNORMAL LOW (ref >59)

## 2022-02-26 LAB — LDL CHOLESTEROL, DIRECT: LDL Direct: 84 mg/dL (ref 0–99)

## 2022-02-26 LAB — HEMOGLOBIN A1C
Est. average glucose Bld gHb Est-mCnc: 117 mg/dL
Hgb A1c MFr Bld: 5.7 % — ABNORMAL HIGH (ref 4.8–5.6)

## 2022-02-26 LAB — TSH: TSH: 1.73 u[IU]/mL (ref 0.450–4.500)

## 2022-02-26 LAB — MICROALBUMIN / CREATININE URINE RATIO
Creatinine, Urine: 11.9 mg/dL
Microalb/Creat Ratio: 37 mg/g creat — ABNORMAL HIGH (ref 0–29)
Microalbumin, Urine: 4.4 ug/mL

## 2022-03-02 ENCOUNTER — Telehealth: Payer: Self-pay

## 2022-03-02 NOTE — Telephone Encounter (Signed)
Cardiac clearance has been granted from [facility].  Documents placed in the to be scanned folder.  Thanks,  [place], [place]

## 2022-03-03 DIAGNOSIS — I10 Essential (primary) hypertension: Secondary | ICD-10-CM | POA: Diagnosis not present

## 2022-03-03 DIAGNOSIS — I2510 Atherosclerotic heart disease of native coronary artery without angina pectoris: Secondary | ICD-10-CM | POA: Diagnosis not present

## 2022-03-03 DIAGNOSIS — I5022 Chronic systolic (congestive) heart failure: Secondary | ICD-10-CM | POA: Diagnosis not present

## 2022-03-03 DIAGNOSIS — E782 Mixed hyperlipidemia: Secondary | ICD-10-CM | POA: Diagnosis not present

## 2022-03-16 ENCOUNTER — Telehealth: Payer: Self-pay

## 2022-03-16 NOTE — Telephone Encounter (Signed)
Patient contacted office to reschedule her colonoscopy.  Colonoscopy has been rescheduled to 05/10/22 with Dr. Tobi Bastos.

## 2022-03-23 LAB — HM DIABETES EYE EXAM

## 2022-04-07 ENCOUNTER — Ambulatory Visit: Payer: Self-pay

## 2022-04-07 NOTE — Telephone Encounter (Signed)
  Chief Complaint: URI Symptoms: runny nose, cough and congestion Frequency: last night  Pertinent Negatives: Patient denies fever or SOB Disposition: [] ED /[] Urgent Care (no appt availability in office) / [] Appointment(In office/virtual)/ []  [facility]/ [x] Home Care/ [] Refused Recommended Disposition /[] [facility]/ []  Follow-up with PCP Additional Notes: pt was advised of what OTC products to try and pt encouraged to monitor BP closely if taken any DM products. Recommended to call back if sx don't improve after a few days.  Pt verbalized understanding.  Summary: Medication question   Patient states she has a runny nose, congestion and cough. Patient wants to know what is safe to take with her other medications.         Reason for Disposition  Care advice for mild cough, questions about  Answer Assessment - Initial Assessment Questions 1. ONSET: "When did the nasal discharge start?"      Last night  3. COUGH: "Do you have a cough?" If Yes, ask: "Describe the color of your sputum" (clear, white, yellow, green)     yes 4. RESPIRATORY DISTRESS: "Describe your breathing."      Normal  5. FEVER: "Do you have a fever?" If Yes, ask: "What is your temperature, how was it measured, and when did it start?"     no 6. SEVERITY: "Overall, how bad are you feeling right now?" (e.g., doesn't interfere with normal activities, staying home from school/work, staying in bed)      Mild  7. OTHER SYMPTOMS: "Do you have any other symptoms?" (e.g., sore throat, earache, wheezing, vomiting)     Runny nose, cough, and congestion  Protocols used: Common Cold-A-AH

## 2022-04-09 ENCOUNTER — Ambulatory Visit (INDEPENDENT_AMBULATORY_CARE_PROVIDER_SITE_OTHER): Payer: Medicare Other | Admitting: Internal Medicine

## 2022-04-09 ENCOUNTER — Encounter: Payer: Self-pay | Admitting: Internal Medicine

## 2022-04-09 VITALS — BP 126/78 | HR 68 | Temp 98.7°F | Ht 62.0 in | Wt 181.4 lb

## 2022-04-09 DIAGNOSIS — J0100 Acute maxillary sinusitis, unspecified: Secondary | ICD-10-CM | POA: Diagnosis not present

## 2022-04-09 DIAGNOSIS — J069 Acute upper respiratory infection, unspecified: Secondary | ICD-10-CM

## 2022-04-09 LAB — POCT INFLUENZA A/B
Influenza A, POC: NEGATIVE
Influenza B, POC: NEGATIVE

## 2022-04-09 LAB — POC COVID19 BINAXNOW: SARS Coronavirus 2 Ag: NEGATIVE

## 2022-04-09 MED ORDER — AZITHROMYCIN 250 MG PO TABS: ORAL_TABLET | ORAL | 0 refills | Status: AC

## 2022-04-09 NOTE — Progress Notes (Signed)
Date:  04/09/2022   Name:  Cassidy Dawson   DOB:  1955-06-05   MRN:  [id number]   Chief Complaint: Cough  Cough This is a new problem. Episode onset: 2 days ago. The problem has been waxing and waning. The problem occurs constantly. The cough is Productive of sputum. Associated symptoms include a fever, headaches, myalgias and nasal congestion. Pertinent negatives include no chest pain (green tinted mucous), chills, postnasal drip, sore throat, shortness of breath or wheezing.    Lab Results  Component Value Date   NA 142 02/24/2022   K 5.0 02/24/2022   CO2 26 02/24/2022   GLUCOSE 95 02/24/2022   BUN 25 02/24/2022   CREATININE 1.38 (H) 02/24/2022   CALCIUM 9.9 02/24/2022   EGFR 42 (L) 02/24/2022   GFRNONAA 78 10/04/2019   Lab Results  Component Value Date   CHOL 164 02/24/2022   HDL 36 (L) 02/24/2022   LDLCALC 101 (H) 02/24/2022   LDLDIRECT 84 02/24/2022   TRIG 155 (H) 02/24/2022   CHOLHDL 4.6 (H) 02/24/2022   Lab Results  Component Value Date   TSH 1.730 02/24/2022   Lab Results  Component Value Date   HGBA1C 5.7 (H) 02/24/2022   Lab Results  Component Value Date   WBC 7.1 02/24/2022   HGB 13.4 02/24/2022   HCT 40.6 02/24/2022   MCV 92 02/24/2022   PLT 403 02/24/2022   Lab Results  Component Value Date   ALT 16 02/24/2022   AST 16 02/24/2022   ALKPHOS 124 (H) 02/24/2022   BILITOT 0.5 02/24/2022   No results found for: "25OHVITD2", "25OHVITD3", "VD25OH"   Review of Systems  Constitutional:  Positive for fever. Negative for chills, diaphoresis and fatigue.  HENT:  Negative for postnasal drip, sinus pressure and sore throat.   Respiratory:  Positive for cough. Negative for chest tightness, shortness of breath and wheezing.   Cardiovascular:  Negative for chest pain (green tinted mucous).  Musculoskeletal:  Positive for myalgias.  Neurological:  Positive for headaches. Negative for dizziness and light-headedness.    Patient Active Problem  List   Diagnosis Date Noted   Chronic kidney disease, stage 3a ([place]) 11/06/2021   Slow transit constipation 02/11/2021   Coronary artery disease of native artery of native heart with stable angina pectoris ([place]) 10/08/2020   Plantar wart 05/02/2018   Hypertriglyceridemia 05/01/2018   Pre-diabetes 05/01/2018   Acute on chronic congestive heart failure ([place]) 01/11/2017   History of non-ST elevation myocardial infarction (NSTEMI) 01/11/2017   Essential (primary) hypertension 08/14/2014   Allergic rhinitis, seasonal 08/14/2014    Allergies  Allergen Reactions   Ace Inhibitors Cough   Codeine Nausea And Vomiting    Past Surgical History:  Procedure Laterality Date   CORONARY ANGIOPLASTY WITH STENT PLACEMENT  01/2017   DES to LAD   [place]    Social History   Tobacco Use   Smoking status: Never   Smokeless tobacco: Never  Vaping Use   Vaping Use: Never used  Substance Use Topics   Alcohol use: Never    Alcohol/week: 0.0 standard drinks of alcohol   Drug use: Never     Medication list has been reviewed and updated.  Current Meds  Medication Sig   Ascorbic Acid (VITAMIN C PO) Take by mouth daily.   aspirin 81 MG chewable tablet Chew 81 mg by mouth daily.   atorvastatin (LIPITOR) 80 MG tablet Take 1 tablet (80 mg total) by mouth  daily.   carvedilol (COREG) 25 MG tablet Take 25 mg by mouth 2 (two) times daily.   cetirizine (ZYRTEC) 10 MG tablet Take 1 tablet (10 mg total) by mouth daily.   Cyanocobalamin (VITAMIN B-12 PO) Take by mouth.   Ergocalciferol 50 MCG (2000 UT) CAPS Take 1 capsule by mouth daily.   fluticasone (FLONASE) 50 MCG/ACT nasal spray Place 2 sprays into both nostrils daily.   furosemide (LASIX) 40 MG tablet Take 40 mg by mouth daily.   JARDIANCE 10 MG TABS tablet Take 10 mg by mouth daily.   nitroGLYCERIN (NITROSTAT) 0.4 MG SL tablet Place 1 tablet (0.4 mg total) under the tongue every 5 (five) minutes as needed for chest pain.    sacubitril-valsartan (ENTRESTO) 49-51 MG Take 1 tablet by mouth 2 (two) times daily.   spironolactone (ALDACTONE) 25 MG tablet Take 25 mg by mouth daily.       02/24/2022   11:06 AM 11/06/2021    3:17 PM 07/21/2021    2:04 PM 06/17/2021    1:43 PM  GAD 7 : Generalized Anxiety Score  Nervous, Anxious, on Edge 0 0 0 0  Control/stop worrying 0 0 0 0  Worry too much - different things 0 0 0 0  Trouble relaxing 0 0 0 0  Restless 0 0 0 0  Easily annoyed or irritable 0 0 0 0  Afraid - awful might happen 0 0 0 0  Total GAD 7 Score 0 0 0 0  Anxiety Difficulty Not difficult at all Not difficult at all Not difficult at all Not difficult at all       02/24/2022   11:06 AM 11/06/2021    3:17 PM 07/21/2021    2:04 PM  Depression screen PHQ 2/9  Decreased Interest 0 0 0  Down, Depressed, Hopeless 0 0 0  PHQ - 2 Score 0 0 0  Altered sleeping 0 0 0  Tired, decreased energy 0 0 0  Change in appetite 0 0 0  Feeling bad or failure about yourself  0 0 0  Trouble concentrating 0 0 0  Moving slowly or fidgety/restless 0 0 0  Suicidal thoughts 0 0 0  PHQ-9 Score 0 0 0  Difficult doing work/chores Not difficult at all Not difficult at all Not difficult at all    BP Readings from Last 3 Encounters:  04/09/22 126/78  02/24/22 118/72  11/06/21 122/74    Physical Exam Constitutional:      Appearance: She is well-developed.  HENT:     Right Ear: Ear canal and external ear normal. Tympanic membrane is not erythematous or retracted.     Left Ear: Ear canal and external ear normal. Tympanic membrane is not erythematous or retracted.     Nose:     Right Sinus: Maxillary sinus tenderness and frontal sinus tenderness present.     Left Sinus: Maxillary sinus tenderness and frontal sinus tenderness present.     Mouth/Throat:     Mouth: No oral lesions.     Pharynx: Uvula midline. Posterior oropharyngeal erythema present. No oropharyngeal exudate.  Cardiovascular:     Rate and Rhythm: Normal rate and  regular rhythm.     Heart sounds: Normal heart sounds.  Pulmonary:     Effort: Pulmonary effort is normal.     Breath sounds: Normal breath sounds. No wheezing or rales.  Musculoskeletal:     Cervical back: Normal range of motion.  Lymphadenopathy:     Cervical: No cervical adenopathy.  Skin:    General: Skin is warm and dry.  Neurological:     Mental Status: She is alert and oriented to person, place, and time.     Wt Readings from Last 3 Encounters:  04/09/22 181 lb 6.4 oz (82.3 kg)  02/24/22 174 lb (78.9 kg)  11/06/21 176 lb (79.8 kg)    BP 126/78   Pulse 68   Temp 98.7 F (37.1 C) (Oral)   Ht _0  (1.575 m)   Wt 181 lb 6.4 oz (82.3 kg)   SpO2 96%   BMI 33.18 kg/m   Assessment and Plan: 1. Viral upper respiratory tract infection Negative for Covid and Flu - POC COVID-19 BinaxNow - POCT Influenza A/B  2. Acute non-recurrent maxillary sinusitis Continue otc cough medications Zpak Follow up if needed. - azithromycin (ZITHROMAX Z-PAK) 250 MG tablet; UAD  Dispense: 6 each; Refill: 0   Partially dictated using Editor, commissioning. Any errors are unintentional.  Halina Maidens, MD [facility]  04/09/2022

## 2022-05-07 ENCOUNTER — Telehealth: Payer: Self-pay

## 2022-05-07 NOTE — Telephone Encounter (Signed)
Returned patients call to reschedule 05/10/22 colonoscopy with Dr. Vicente Males.  LVM for pt to return my call.   Thanks,  [place], [place]

## 2022-05-10 ENCOUNTER — Ambulatory Visit: Admission: RE | Admit: 2022-05-10 | Payer: Medicare Other | Source: Home / Self Care | Admitting: Gastroenterology

## 2022-05-10 ENCOUNTER — Encounter: Admission: RE | Payer: Self-pay | Source: Home / Self Care

## 2022-05-10 SURGERY — COLONOSCOPY WITH PROPOFOL
Anesthesia: General

## 2022-06-10 ENCOUNTER — Telehealth (INDEPENDENT_AMBULATORY_CARE_PROVIDER_SITE_OTHER): Payer: Medicare Other | Admitting: Family Medicine

## 2022-06-10 ENCOUNTER — Encounter: Payer: Self-pay | Admitting: Family Medicine

## 2022-06-10 ENCOUNTER — Ambulatory Visit: Payer: Self-pay | Admitting: *Deleted

## 2022-06-10 VITALS — HR 74

## 2022-06-10 DIAGNOSIS — U071 COVID-19: Secondary | ICD-10-CM | POA: Diagnosis not present

## 2022-06-10 MED ORDER — PROMETHAZINE-DM 6.25-15 MG/5ML PO SYRP: 1.2500 mL | ORAL_SOLUTION | Freq: Four times a day (QID) | ORAL | 0 refills | Status: DC | PRN

## 2022-06-10 MED ORDER — NIRMATRELVIR/RITONAVIR (PAXLOVID) TABLET (RENAL DOSING): 2.0000 | ORAL_TABLET | Freq: Two times a day (BID) | ORAL | 0 refills | Status: AC

## 2022-06-10 NOTE — Progress Notes (Signed)
Primary Care / Sports Medicine Virtual Visit  Patient Information:  Patient ID: Cassidy Dawson, female DOB: [birth year] Age: 67 y.o. MRN: [id number]   Cassidy Dawson is a pleasant 67 y.o. female presenting with the following:  Chief Complaint  Patient presents with   Covid Positive    Took test [facility] today is positive. Nose congestion, low grade fever, and bad cough, for 2 days    Review of Systems: No fevers, chills, night sweats, weight loss, chest pain, or shortness of breath.   Patient Active Problem List   Diagnosis Date Noted   COVID 06/10/2022   Chronic kidney disease, stage 3a ([place]) 11/06/2021   Slow transit constipation 02/11/2021   Coronary artery disease of native artery of native heart with stable angina pectoris ([place]) 10/08/2020   Plantar wart 05/02/2018   Hypertriglyceridemia 05/01/2018   Pre-diabetes 05/01/2018   Acute on chronic congestive heart failure ([place]) 01/11/2017   History of non-ST elevation myocardial infarction (NSTEMI) 01/11/2017   Essential (primary) hypertension 08/14/2014   Allergic rhinitis, seasonal 08/14/2014   Past Medical History:  Diagnosis Date   Environmental and seasonal allergies    Flash pulmonary edema ([place]) 01/02/2021   Heart attack ([place]) 06/2016   Second: 01/2017    History of biliary T-tube placement 08/14/2014   Hyperlipidemia    Hypertension    Outpatient Encounter Medications as of 06/10/2022  Medication Sig   Ascorbic Acid (VITAMIN C PO) Take by mouth daily.   aspirin 81 MG chewable tablet Chew 81 mg by mouth daily.   atorvastatin (LIPITOR) 80 MG tablet Take 1 tablet (80 mg total) by mouth daily.   carvedilol (COREG) 25 MG tablet Take 25 mg by mouth 2 (two) times daily.   cetirizine (ZYRTEC) 10 MG tablet Take 1 tablet (10 mg total) by mouth daily.   Cyanocobalamin (VITAMIN B-12 PO) Take by mouth.   Ergocalciferol 50 MCG (2000 UT) CAPS Take 1 capsule by mouth daily.   fluticasone (FLONASE) 50 MCG/ACT  nasal spray Place 2 sprays into both nostrils daily.   furosemide (LASIX) 40 MG tablet Take 40 mg by mouth daily.   JARDIANCE 10 MG TABS tablet Take 10 mg by mouth daily.   nirmatrelvir/ritonavir, renal dosing, (PAXLOVID) 10 x 150 MG & 10 x 100MG  TABS Take 2 tablets by mouth 2 (two) times daily for 5 days. (Take nirmatrelvir 150 mg one tablet twice daily for 5 days and ritonavir 100 mg one tablet twice daily for 5 days) Patient GFR is 46.1   nitroGLYCERIN (NITROSTAT) 0.4 MG SL tablet Place 1 tablet (0.4 mg total) under the tongue every 5 (five) minutes as needed for chest pain.   promethazine-dextromethorphan (PROMETHAZINE-DM) 6.25-15 MG/5ML syrup Take 1.3 mLs by mouth 4 (four) times daily as needed for cough.   sacubitril-valsartan (ENTRESTO) 49-51 MG Take 1 tablet by mouth 2 (two) times daily.   spironolactone (ALDACTONE) 25 MG tablet Take 25 mg by mouth daily.   No facility-administered encounter medications on file as of 06/10/2022.   Past Surgical History:  Procedure Laterality Date   CORONARY ANGIOPLASTY WITH STENT PLACEMENT  01/2017   DES to LAD   [place]    Virtual Visit via MyChart Video:   I connected with Cassidy Dawson on 06/10/22 via MyChart Video and verified that I am speaking with the correct person using appropriate identifiers.   The limitations, risks, security and privacy concerns of performing an evaluation and management service by  MyChart Video, including the higher likelihood of inaccurate diagnoses and treatments, and the availability of in person appointments were reviewed. The possible need of an additional face-to-face encounter for complete and high quality delivery of care was discussed. The patient was also made aware that there may be a patient responsible charge related to this service. The patient expressed understanding and wishes to proceed.  Provider location is in medical facility. Patient location is at their home, different  from provider location. People involved in care of the patient during this telehealth encounter were myself, my nurse/medical assistant, and my front office/scheduling team member.  Objective findings:   General: Speaking full sentences, no audible heavy breathing. Sounds alert and appropriately interactive. Well-appearing. Face symmetric. Extraocular movements intact. Pupils equal and round. No nasal flaring or accessory muscle use visualized.  Today's Vitals   06/10/22 1621  Pulse: 74  SpO2: 96%   There is no height or weight on file to calculate BMI.   Independent interpretation of notes and tests performed by another provider:   None  Pertinent History, Exam, Impression, and Recommendations:   COVID Started with congestion Tuesday, progressed to cough, began to have chills and 99.4 Tmax. Cough has been essentially nonproductive, denies any SOA, no chest pain, no myalgias.  Patient took a home COVID test today which was positive.  Additionally, patient has home pulse oximeter, O2 sats recorded.  Given patient's medical history, timeline of symptoms, positive COVID result, Paxlovid renal dose prescribed today, adjunct low-dose Phenergan-DM prescribed for supportive care.  She was advised to seek follow-up if symptoms persist without improvement despite Paxlovid but understands that milder lingering symptoms may last for several weeks.   Orders & Medications Meds ordered this encounter  Medications   nirmatrelvir/ritonavir, renal dosing, (PAXLOVID) 10 x 150 MG & 10 x 100MG  TABS    Sig: Take 2 tablets by mouth 2 (two) times daily for 5 days. (Take nirmatrelvir 150 mg one tablet twice daily for 5 days and ritonavir 100 mg one tablet twice daily for 5 days) Patient GFR is 46.1    Dispense:  20 tablet    Refill:  0   promethazine-dextromethorphan (PROMETHAZINE-DM) 6.25-15 MG/5ML syrup    Sig: Take 1.3 mLs by mouth 4 (four) times daily as needed for cough.    Dispense:  118 mL     Refill:  0   No orders of the defined types were placed in this encounter.    I discussed the above assessment and treatment plan with the patient. The patient was provided an opportunity to ask questions and all were answered. The patient agreed with the plan and demonstrated an understanding of the instructions.   The patient was advised to call back or seek an in-person evaluation if the symptoms worsen or if the condition fails to improve as anticipated.   I provided a total time of 30 minutes including both face-to-face and non-face-to-face time on 06/10/2022 inclusive of time utilized for medical chart review, information gathering, care coordination with staff, and documentation completion.    Montel Culver, MD, [facility]   Primary Care Sports Medicine Primary Care and Sports Medicine at [facility]

## 2022-06-10 NOTE — Assessment & Plan Note (Signed)
Started with congestion Tuesday, progressed to cough, began to have chills and 99.4 Tmax. Cough has been essentially nonproductive, denies any SOA, no chest pain, no myalgias.  Patient took a home COVID test today which was positive.  Additionally, patient has home pulse oximeter, O2 sats recorded.  Given patient's medical history, timeline of symptoms, positive COVID result, Paxlovid renal dose prescribed today, adjunct low-dose Phenergan-DM prescribed for supportive care.  She was advised to seek follow-up if symptoms persist without improvement despite Paxlovid but understands that milder lingering symptoms may last for several weeks.

## 2022-06-10 NOTE — Patient Instructions (Signed)
-   Take antiviral for full course - Can use Rx cough syrup as needed - Drink plenty of water, get plenty of rest - Review quarantine information attached - Contact our office if symptoms persist without improvement despite the above - Follow-up as needed

## 2022-06-10 NOTE — Telephone Encounter (Signed)
3rd attempt to contact patient on [phone] to review covid positive sx. No answer, LVMTCB [phone]. Please advise

## 2022-06-10 NOTE — Telephone Encounter (Signed)
2nd call attempted to contact patient , no answer, LVMTCB [phone].

## 2022-06-10 NOTE — Telephone Encounter (Signed)
Summary: positive covid test   Pt took a covid test at 12:15pm today and it was positive. Pt is wanting to see if something can be called in for her. Pt states the practice advised her if it is positive to call back and let them know. Symptoms: coughing, congestion, chills, low grade fever 100   Please advise        Called patient [phone] to review covid positive test and sx. No answer, LVMTCB [phone].

## 2022-06-25 ENCOUNTER — Telehealth: Payer: Self-pay | Admitting: Internal Medicine

## 2022-06-25 NOTE — Telephone Encounter (Signed)
Copied from [place] [id number]. Topic: Medicare AWV >> Jun 25, 2022  9:31 AM Devoria Glassing wrote: Reason for CRM: Called patient to re schedule Medicare Annual Wellness Visit (AWV) on 07/12/2022. Left message for patient to call back and confirm appointment change date to 07/15/2022 at 9:30pm for her Medicare Annual Wellness Visit (AWV).  Last date of AWV: 07/06/2021   Please schedule an appointment at any time with Kirke Shaggy, [facility]    If any questions, please contact me.  Thank you ,  Sherol Dade; [place] Direct Dial: [phone]

## 2022-07-12 ENCOUNTER — Ambulatory Visit: Payer: Medicare Other

## 2022-07-15 ENCOUNTER — Ambulatory Visit (INDEPENDENT_AMBULATORY_CARE_PROVIDER_SITE_OTHER): Payer: Medicare Other

## 2022-07-15 VITALS — Ht 62.0 in | Wt 181.0 lb

## 2022-07-15 DIAGNOSIS — Z1211 Encounter for screening for malignant neoplasm of colon: Secondary | ICD-10-CM | POA: Diagnosis not present

## 2022-07-15 DIAGNOSIS — Z1231 Encounter for screening mammogram for malignant neoplasm of breast: Secondary | ICD-10-CM | POA: Diagnosis not present

## 2022-07-15 DIAGNOSIS — Z0000 Encounter for general adult medical examination without abnormal findings: Secondary | ICD-10-CM

## 2022-07-15 NOTE — Patient Instructions (Signed)
Cassidy Dawson , Thank you for taking time to come for your Medicare Wellness Visit. I appreciate your ongoing commitment to your health goals. Please review the following plan we discussed and let me know if I can assist you in the future.   These are the goals we discussed:  Goals      DIET - EAT MORE FRUITS AND VEGETABLES     Increase physical activity     Recommend increasing physical activity to at least 3 days per week         This is a list of the screening recommended for you and due dates:  Health Maintenance  Topic Date Due   Zoster (Shingles) Vaccine (1 of 2) Never done   Colon Cancer Screening  Never done   Mammogram  08/12/2021   COVID-19 Vaccine (5 - 2023-24 season) 04/21/2022   Medicare Annual Wellness Visit  07/15/2023   DTaP/Tdap/Td vaccine (2 - Td or Tdap) 06/13/2031   Pneumonia Vaccine  Completed   Flu Shot  Completed   DEXA scan (bone density measurement)  Completed   Hepatitis C Screening: USPSTF Recommendation to screen - Ages 52-79 yo.  Completed   HPV Vaccine  Aged Out    Advanced directives: no  Conditions/risks identified: none  Next appointment: Follow up in one year for your annual wellness visit 07/20/23 @ 2:30 pm by phone  Preventive Care 65 Years and Older, Female Preventive care refers to lifestyle choices and visits with your health care provider that can promote health and wellness. What does preventive care include? A yearly physical exam. This is also called an annual well check. Dental exams once or twice a year. Routine eye exams. Ask your health care provider how often you should have your eyes checked. Personal lifestyle choices, including: Daily care of your teeth and gums. Regular physical activity. Eating a healthy diet. Avoiding tobacco and drug use. Limiting alcohol use. Practicing safe sex. Taking low-dose aspirin every day. Taking vitamin and mineral supplements as recommended by your health care provider. What happens during  an annual well check? The services and screenings done by your health care provider during your annual well check will depend on your age, overall health, lifestyle risk factors, and family history of disease. Counseling  Your health care provider may ask you questions about your: Alcohol use. Tobacco use. Drug use. Emotional well-being. Home and relationship well-being. Sexual activity. Eating habits. History of falls. Memory and ability to understand (cognition). Work and work Statistician. Reproductive health. Screening  You may have the following tests or measurements: Height, weight, and BMI. Blood pressure. Lipid and cholesterol levels. These may be checked every 5 years, or more frequently if you are over 56 years old. Skin check. Lung cancer screening. You may have this screening every year starting at age 67 if you have a 30-pack-year history of smoking and currently smoke or have quit within the past 15 years. Fecal occult blood test (FOBT) of the stool. You may have this test every year starting at age 86. Flexible sigmoidoscopy or colonoscopy. You may have a sigmoidoscopy every 5 years or a colonoscopy every 10 years starting at age 43. Hepatitis C blood test. Hepatitis B blood test. Sexually transmitted disease (STD) testing. Diabetes screening. This is done by checking your blood sugar (glucose) after you have not eaten for a while (fasting). You may have this done every 1-3 years. Bone density scan. This is done to screen for osteoporosis. You may have this done  starting at age 21. Mammogram. This may be done every 1-2 years. Talk to your health care provider about how often you should have regular mammograms. Talk with your health care provider about your test results, treatment options, and if necessary, the need for more tests. Vaccines  Your health care provider may recommend certain vaccines, such as: Influenza vaccine. This is recommended every year. Tetanus,  diphtheria, and acellular pertussis (Tdap, Td) vaccine. You may need a Td booster every 10 years. Zoster vaccine. You may need this after age 90. Pneumococcal 13-valent conjugate (PCV13) vaccine. One dose is recommended after age 62. Pneumococcal polysaccharide (PPSV23) vaccine. One dose is recommended after age 52. Talk to your health care provider about which screenings and vaccines you need and how often you need them. This information is not intended to replace advice given to you by your health care provider. Make sure you discuss any questions you have with your health care provider. Document Released: 05/16/2015 Document Revised: 01/07/2016 Document Reviewed: 02/18/2015 Elsevier Interactive Patient Education  2017 [place] Prevention in the Home Falls can cause injuries. They can happen to people of all ages. There are many things you can do to make your home safe and to help prevent falls. What can I do on the outside of my home? Regularly fix the edges of walkways and driveways and fix any cracks. Remove anything that might make you trip as you walk through a door, such as a raised step or threshold. Trim any bushes or trees on the path to your home. Use bright outdoor lighting. Clear any walking paths of anything that might make someone trip, such as rocks or tools. Regularly check to see if handrails are loose or broken. Make sure that both sides of any steps have handrails. Any raised decks and porches should have guardrails on the edges. Have any leaves, snow, or ice cleared regularly. Use sand or salt on walking paths during winter. Clean up any spills in your garage right away. This includes oil or grease spills. What can I do in the bathroom? Use night lights. Install grab bars by the toilet and in the tub and shower. Do not use towel bars as grab bars. Use non-skid mats or decals in the tub or shower. If you need to sit down in the shower, use a plastic,  non-slip stool. Keep the floor dry. Clean up any water that spills on the floor as soon as it happens. Remove soap buildup in the tub or shower regularly. Attach bath mats securely with double-sided non-slip rug tape. Do not have throw rugs and other things on the floor that can make you trip. What can I do in the bedroom? Use night lights. Make sure that you have a light by your bed that is easy to reach. Do not use any sheets or blankets that are too big for your bed. They should not hang down onto the floor. Have a firm chair that has side arms. You can use this for support while you get dressed. Do not have throw rugs and other things on the floor that can make you trip. What can I do in the kitchen? Clean up any spills right away. Avoid walking on wet floors. Keep items that you use a lot in easy-to-reach places. If you need to reach something above you, use a strong step stool that has a grab bar. Keep electrical cords out of the way. Do not use floor polish or wax that  makes floors slippery. If you must use wax, use non-skid floor wax. Do not have throw rugs and other things on the floor that can make you trip. What can I do with my stairs? Do not leave any items on the stairs. Make sure that there are handrails on both sides of the stairs and use them. Fix handrails that are broken or loose. Make sure that handrails are as long as the stairways. Check any carpeting to make sure that it is firmly attached to the stairs. Fix any carpet that is loose or worn. Avoid having throw rugs at the top or bottom of the stairs. If you do have throw rugs, attach them to the floor with carpet tape. Make sure that you have a light switch at the top of the stairs and the bottom of the stairs. If you do not have them, ask someone to add them for you. What else can I do to help prevent falls? Wear shoes that: Do not have high heels. Have rubber bottoms. Are comfortable and fit you well. Are closed  at the toe. Do not wear sandals. If you use a stepladder: Make sure that it is fully opened. Do not climb a closed stepladder. Make sure that both sides of the stepladder are locked into place. Ask someone to hold it for you, if possible. Clearly mark and make sure that you can see: Any grab bars or handrails. First and last steps. Where the edge of each step is. Use tools that help you move around (mobility aids) if they are needed. These include: Canes. Walkers. Scooters. Crutches. Turn on the lights when you go into a dark area. Replace any light bulbs as soon as they burn out. Set up your furniture so you have a clear path. Avoid moving your furniture around. If any of your floors are uneven, fix them. If there are any pets around you, be aware of where they are. Review your medicines with your doctor. Some medicines can make you feel dizzy. This can increase your chance of falling. Ask your doctor what other things that you can do to help prevent falls. This information is not intended to replace advice given to you by your health care provider. Make sure you discuss any questions you have with your health care provider. Document Released: 02/13/2009 Document Revised: 09/25/2015 Document Reviewed: 05/24/2014 Elsevier Interactive Patient Education  2017 [employer].

## 2022-07-15 NOTE — Progress Notes (Signed)
I connected with  Cassidy Dawson on 07/15/22 by a audio enabled telemedicine application and verified that I am speaking with the correct person using two identifiers.  Patient Location: Home  Provider Location: Office/Clinic  I discussed the limitations of evaluation and management by telemedicine. The patient expressed understanding and agreed to proceed.  Subjective:   Cassidy Dawson is a 67 y.o. female who presents for Medicare Annual (Subsequent) preventive examination.  Review of Systems     Cardiac Risk Factors include: advanced age (>47mn, >>62women);hypertension;dyslipidemia     Objective:    There were no vitals filed for this visit. There is no height or weight on file to calculate BMI.     07/15/2022    9:34 AM 07/06/2021    9:27 AM 06/30/2020    9:48 AM  Advanced Directives  Does Patient Have a Medical Advance Directive? No No No  Would patient like information on creating a medical advance directive? No - Patient declined Yes (MAU/Ambulatory/Procedural Areas - Information given) Yes (MAU/Ambulatory/Procedural Areas - Information given)    Current Medications (verified) Outpatient Encounter Medications as of 07/15/2022  Medication Sig   Ascorbic Acid (VITAMIN C PO) Take by mouth daily.   aspirin 81 MG chewable tablet Chew 81 mg by mouth daily.   atorvastatin (LIPITOR) 80 MG tablet Take 1 tablet (80 mg total) by mouth daily.   carvedilol (COREG) 25 MG tablet Take 25 mg by mouth 2 (two) times daily.   cetirizine (ZYRTEC) 10 MG tablet Take 1 tablet (10 mg total) by mouth daily.   Cyanocobalamin (VITAMIN B-12 PO) Take by mouth.   Ergocalciferol 50 MCG (2000 UT) CAPS Take 1 capsule by mouth daily.   fluticasone (FLONASE) 50 MCG/ACT nasal spray Place 2 sprays into both nostrils daily.   furosemide (LASIX) 40 MG tablet Take 40 mg by mouth daily.   JARDIANCE 10 MG TABS tablet Take 10 mg by mouth daily.   nitroGLYCERIN (NITROSTAT) 0.4 MG SL tablet Place  1 tablet (0.4 mg total) under the tongue every 5 (five) minutes as needed for chest pain.   promethazine-dextromethorphan (PROMETHAZINE-DM) 6.25-15 MG/5ML syrup Take 1.3 mLs by mouth 4 (four) times daily as needed for cough.   sacubitril-valsartan (ENTRESTO) 49-51 MG Take 1 tablet by mouth 2 (two) times daily.   spironolactone (ALDACTONE) 25 MG tablet Take 25 mg by mouth daily.   No facility-administered encounter medications on file as of 07/15/2022.    Allergies (verified) Ace inhibitors and Codeine   History: Past Medical History:  Diagnosis Date   Environmental and seasonal allergies    Flash pulmonary edema ([place] 01/02/2021   Heart attack ([facility] 06/2016   Second: 01/2017    History of biliary T-tube placement 08/14/2014   Hyperlipidemia    Hypertension    Past Surgical History:  Procedure Laterality Date   CORONARY ANGIOPLASTY WITH STENT PLACEMENT  01/2017   [facility] LAD   [facility]  Family History  Problem Relation Age of Onset   Hypertension Father    Kidney disease Father    Breast cancer Mother    Hypertension Brother    Renal Disease Brother    Social History   Socioeconomic History   Marital status: Married    Spouse name: Not on file   Number of children: 0   Years of education: Not on file   Highest education level: Not on file  Occupational History   Not on file  Tobacco Use  Smoking status: Never   Smokeless tobacco: Never  Vaping Use   Vaping Use: Never used  Substance and Sexual Activity   Alcohol use: Never    Alcohol/week: 0.0 standard drinks of alcohol   Drug use: Never   Sexual activity: Not Currently  Other Topics Concern   Not on file  Social History Narrative   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[employer]   Primary caregiver for mother who lives in [place]   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   Financial Resource Strain: [place]  (07/15/2022)   Overall Financial Resource Strain (CARDIA)     Difficulty of Paying Living Expenses: Not hard at all  Food Insecurity: No Food Insecurity (07/15/2022)   Hunger Vital Sign    Worried About Running Out of Food in the Last Year: Never true    [place] in the Last Year: Never true  Transportation Needs: No Transportation Needs (07/15/2022)   PRAPARE - Hydrologist (Medical): No    Lack of Transportation (Non-Medical): No  Physical Activity: Insufficiently Active (07/15/2022)   Exercise Vital Sign    Days of Exercise per Week: 3 days    Minutes of Exercise per Session: 30 min  Stress: No Stress Concern Present (07/15/2022)   [place]    Feeling of Stress : Not at all  Social Connections: Moderately Integrated (07/15/2022)   Social Connection and Isolation Panel [NHANES]    Frequency of Communication with Friends and Family: More than three times a week    Frequency of Social Gatherings with Friends and Family: Once a week    Attends Religious Services: More than 4 times per year    Active Member of [employer] or Organizations: No    Attends Music therapist: Never    Marital Status: Married    Tobacco Counseling Counseling given: Not Answered   Clinical Intake:  Pre-visit preparation completed: Yes  Pain : No/denies pain     Nutritional Risks: None Diabetes: No  How often do you need to have someone help you when you read instructions, pamphlets, or other written materials from your doctor or pharmacy?: 1 - Never  Diabetic?no  Interpreter Needed?: No  Information entered by :: Kirke Shaggy, LPN   Activities of Daily Living    07/15/2022    9:35 AM 07/21/2021    2:04 PM  In your present state of health, do you have any difficulty performing the following activities:  Hearing? 0 0  Vision? 0 0  Difficulty concentrating or making decisions? 0 0  Walking or climbing stairs? 0 0  Dressing or bathing? 0 0   Doing errands, shopping? 0 0  Preparing Food and eating ? N   Using the Toilet? N   In the past six months, have you accidently leaked urine? N   Do you have problems with loss of bowel control? N   Managing your Medications? N   Managing your Finances? N   Housekeeping or managing your Housekeeping? N     Patient Care Team: Glean Hess, MD as PCP - General (Internal Medicine) Levonne Spiller, MD as Referring Physician (Cardiology) Jorge Mandril, MD as Referring Physician (Cardiology)  Indicate any recent Medical Services you may have received from other than [facility] providers in the past year (date may be approximate).     Assessment:   This is a routine wellness  examination for Cassidy Dawson.  Hearing/Vision screen Hearing Screening - Comments:: No aids Vision Screening - Comments:: Wears glasses- Dr.Shade  Dietary issues and exercise activities discussed: Current Exercise Habits: Home exercise routine, Type of exercise: walking, Time (Minutes): 30, Frequency (Times/Week): 3, Weekly Exercise (Minutes/Week): 90, Intensity: Mild   Goals Addressed             This Visit's Progress    DIET - EAT MORE FRUITS AND VEGETABLES         Depression Screen    07/15/2022    9:33 AM 04/09/2022    9:25 AM 02/24/2022   11:06 AM 11/06/2021    3:17 PM 07/21/2021    2:04 PM 07/06/2021    9:25 AM 06/17/2021    1:43 PM  PHQ 2/9 Scores  PHQ - 2 Score 0 0 0 0 0 0 0  PHQ- 9 Score 0 0 0 0 0 2 1    Fall Risk    07/15/2022    9:35 AM 04/09/2022    9:25 AM 02/24/2022   11:06 AM 11/06/2021    3:17 PM 07/21/2021    2:04 PM  [place] in the past year? 0 0 0 0 0  Number falls in past yr: 0 0 0 0 0  Injury with Fall? 0 0 0 0 0  Risk for fall due to : No Fall Risks No Fall Risks No Fall Risks No Fall Risks No Fall Risks  Follow up Falls prevention discussed;Falls evaluation completed Falls evaluation completed Falls evaluation completed Falls evaluation completed Falls evaluation  completed    FALL RISK PREVENTION PERTAINING TO THE HOME:  Any stairs in or around the home? No  If so, are there any without handrails? No  Home free of loose throw rugs in walkways, pet beds, electrical cords, etc? Yes  Adequate lighting in your home to reduce risk of falls? Yes   ASSISTIVE DEVICES UTILIZED TO PREVENT FALLS:  Life alert? No  Use of a cane, walker or w/c? No  Grab bars in the bathroom? Yes  Shower chair or bench in shower? No  Elevated toilet seat or a handicapped toilet? Yes    Cognitive Function:        07/15/2022    9:40 AM  6CIT Screen  What Year? 0 points  What month? 0 points  What time? 0 points  Count back from 20 0 points  Months in reverse 0 points  Repeat phrase 0 points  Total Score 0 points    Immunizations Immunization History  Administered Date(s) Administered   COVID-19, mRNA, vaccine(Comirnaty)12 years and older 02/24/2022   Fluad Quad(high Dose 65+) 01/14/2021, 02/24/2022   Influenza,inj,Quad PF,6+ Mos 06/28/2016, 05/02/2018, 01/22/2019   Janssen (J&J) SARS-COV-2 Vaccination 07/14/2019   PFIZER(Purple Top)SARS-COV-2 Vaccination 02/01/2020, 06/12/2021   Pneumococcal Conjugate-13 10/04/2019   Pneumococcal Polysaccharide-23 10/08/2020   Tdap 06/12/2021    TDAP status: Up to date  Flu Vaccine status: Up to date  Pneumococcal vaccine status: Up to date  Covid-19 vaccine status: Completed vaccines  Qualifies for Shingles Vaccine? Yes   Zostavax completed No   Shingrix Completed?: No.    Education has been provided regarding the importance of this vaccine. Patient has been advised to call insurance company to determine out of pocket expense if they have not yet received this vaccine. Advised may also receive vaccine at local pharmacy or Health Dept. Verbalized acceptance and understanding.  Screening Tests Health Maintenance  Topic Date Due  Zoster Vaccines- Shingrix (1 of 2) Never done   COLONOSCOPY (Pts 45-13yr Insurance  coverage will need to be confirmed)  Never done   MAMMOGRAM  08/12/2021   COVID-19 Vaccine (5 - 2023-24 season) 04/21/2022   Medicare Annual Wellness (AWV)  07/15/2023   DTaP/Tdap/Td (2 - Td or Tdap) 06/13/2031   Pneumonia Vaccine 67 Years old  Completed   INFLUENZA VACCINE  Completed   DEXA SCAN  Completed   Hepatitis C Screening  Completed   HPV VACCINES  Aged Out    Health Maintenance  Health Maintenance Due  Topic Date Due   Zoster Vaccines- Shingrix (1 of 2) Never done   COLONOSCOPY (Pts 45-474yrInsurance coverage will need to be confirmed)  Never done   MAMMOGRAM  08/12/2021   COVID-19 Vaccine (5 - 2023-24 season) 04/21/2022    Colorectal cancer screening: Referral to GI placed 07/15/22. Pt aware the office will call re: appt.  Mammogram status: Ordered 07/15/22. Pt provided with contact info and advised to call to schedule appt.   Bone Density status: Completed 08/12/20. Results reflect: Bone density results: NORMAL. Repeat every 5 years.  Lung Cancer Screening: (Low Dose CT Chest recommended if Age 286-80ears, 30 pack-year currently smoking OR have quit w/in 15years.) does not qualify.    Additional Screening:  Hepatitis C Screening: does qualify; Completed 10/02/18  Vision Screening: Recommended annual ophthalmology exams for early detection of glaucoma and other disorders of the eye. Is the patient up to date with their annual eye exam?  Yes  Who is the provider or what is the name of the office in which the patient attends annual eye exams? Dr.Shade  If pt is not established with a provider, would they like to be referred to a provider to establish care? No .   Dental Screening: Recommended annual dental exams for proper oral hygiene  Community Resource Referral / Chronic Care Management: CRR required this visit?  No   CCM required this visit?  No      Plan:     I have personally reviewed and noted the following in the patient's chart:   Medical and  social history Use of alcohol, tobacco or illicit drugs  Current medications and supplements including opioid prescriptions. Patient is not currently taking opioid prescriptions. Functional ability and status Nutritional status Physical activity Advanced directives List of other physicians Hospitalizations, surgeries, and ER visits in previous 12 months Vitals Screenings to include cognitive, depression, and falls Referrals and appointments  In addition, I have reviewed and discussed with patient certain preventive protocols, quality metrics, and best practice recommendations. A written personalized care plan for preventive services as well as general preventive health recommendations were provided to patient.     LoDionisio DavidLPN   [id number] Nurse Notes:  referrals for colonoscopy and mammogram

## 2022-08-04 ENCOUNTER — Encounter: Payer: Self-pay | Admitting: *Deleted

## 2022-08-26 ENCOUNTER — Ambulatory Visit: Payer: Medicare Other | Admitting: Internal Medicine

## 2022-08-26 NOTE — Assessment & Plan Note (Deleted)
On atorvastatin Cardiology added Zetia in November for more LDL lowering

## 2022-08-26 NOTE — Assessment & Plan Note (Deleted)
Clinically stable exam with well controlled BP on coreg, spironolactone, Entresto, and lasix. Tolerating medications without side effects. Pt to continue current regimen and low sodium diet. She is followed by Cardiology at [facility].

## 2022-08-26 NOTE — Assessment & Plan Note (Deleted)
Controlled with diet changes Lab Results  Component Value Date   HGBA1C 5.7 (H) 02/24/2022

## 2022-08-26 NOTE — Progress Notes (Deleted)
Date:  08/26/2022   Name:  Cassidy Dawson   DOB:  [birth year]   MRN:  [id number]   Chief Complaint: No chief complaint on file.  Hypertension This is a chronic problem. The problem is controlled. Pertinent negatives include no chest pain, headaches, palpitations or shortness of breath. Past treatments include beta blockers, diuretics and angiotensin blockers. The current treatment provides moderate improvement. Hypertensive end-organ damage includes CAD/MI. There is no history of kidney disease or CVA.  Hyperlipidemia This is a chronic problem. The problem is uncontrolled. Pertinent negatives include no chest pain or shortness of breath. Current antihyperlipidemic treatment includes statins and ezetimibe (zetia added 6 mo ago).    Lab Results  Component Value Date   NA 142 02/24/2022   K 5.0 02/24/2022   CO2 26 02/24/2022   GLUCOSE 95 02/24/2022   BUN 25 02/24/2022   CREATININE 1.38 (H) 02/24/2022   CALCIUM 9.9 02/24/2022   EGFR 42 (L) 02/24/2022   GFRNONAA 78 10/04/2019   Lab Results  Component Value Date   CHOL 164 02/24/2022   HDL 36 (L) 02/24/2022   LDLCALC 101 (H) 02/24/2022   LDLDIRECT 84 02/24/2022   TRIG 155 (H) 02/24/2022   CHOLHDL 4.6 (H) 02/24/2022   Lab Results  Component Value Date   TSH 1.730 02/24/2022   Lab Results  Component Value Date   HGBA1C 5.7 (H) 02/24/2022   Lab Results  Component Value Date   WBC 7.1 02/24/2022   HGB 13.4 02/24/2022   HCT 40.6 02/24/2022   MCV 92 02/24/2022   PLT 403 02/24/2022   Lab Results  Component Value Date   ALT 16 02/24/2022   AST 16 02/24/2022   ALKPHOS 124 (H) 02/24/2022   BILITOT 0.5 02/24/2022   No results found for: "25OHVITD2", "25OHVITD3", "VD25OH"   Review of Systems  Constitutional:  Negative for fatigue and unexpected weight change.  HENT:  Negative for nosebleeds.   Eyes:  Negative for visual disturbance.  Respiratory:  Negative for cough, chest tightness, shortness of breath and  wheezing.   Cardiovascular:  Negative for chest pain, palpitations and leg swelling.  Gastrointestinal:  Negative for abdominal pain, constipation and diarrhea.  Neurological:  Negative for dizziness, weakness, light-headedness and headaches.    Patient Active Problem List   Diagnosis Date Noted   COVID 06/10/2022   Chronic kidney disease, stage 3a 11/06/2021   Slow transit constipation 02/11/2021   Coronary artery disease of native artery of native heart with stable angina pectoris 10/08/2020   Plantar wart 05/02/2018   Hypertriglyceridemia 05/01/2018   Pre-diabetes 05/01/2018   Acute on chronic congestive heart failure 01/11/2017   History of non-ST elevation myocardial infarction (NSTEMI) 01/11/2017   Essential (primary) hypertension 08/14/2014   Allergic rhinitis, seasonal 08/14/2014    Allergies  Allergen Reactions   Ace Inhibitors Cough   Codeine Nausea And Vomiting    Past Surgical History:  Procedure Laterality Date   CORONARY ANGIOPLASTY WITH STENT PLACEMENT  01/2017   DES to LAD   UTERINE FIBROID SURGERY  1996    Social History   Tobacco Use   Smoking status: Never   Smokeless tobacco: Never  Vaping Use   Vaping Use: Never used  Substance Use Topics   Alcohol use: Never    Alcohol/week: 0.0 standard drinks of alcohol   Drug use: Never     Medication list has been reviewed and updated.  No outpatient medications have been marked as taking for the 08/26/22  encounter (Appointment) with Reubin Milan, MD.       04/09/2022    9:25 AM 02/24/2022   11:06 AM 11/06/2021    3:17 PM 07/21/2021    2:04 PM  GAD 7 : Generalized Anxiety Score  Nervous, Anxious, on Edge 0 0 0 0  Control/stop worrying 0 0 0 0  Worry too much - different things 0 0 0 0  Trouble relaxing 0 0 0 0  Restless 0 0 0 0  Easily annoyed or irritable 0 0 0 0  Afraid - awful might happen 0 0 0 0  Total GAD 7 Score 0 0 0 0  Anxiety Difficulty Not difficult at all Not difficult at all  Not difficult at all Not difficult at all       07/15/2022    9:33 AM 04/09/2022    9:25 AM 02/24/2022   11:06 AM  Depression screen PHQ 2/9  Decreased Interest 0 0 0  Down, Depressed, Hopeless 0 0 0  PHQ - 2 Score 0 0 0  Altered sleeping 0 0 0  Tired, decreased energy 0 0 0  Change in appetite 0 0 0  Feeling bad or failure about yourself  0 0 0  Trouble concentrating 0 0 0  Moving slowly or fidgety/restless 0 0 0  Suicidal thoughts 0 0 0  PHQ-9 Score 0 0 0  Difficult doing work/chores Not difficult at all Not difficult at all Not difficult at all    BP Readings from Last 3 Encounters:  04/09/22 126/78  02/24/22 118/72  11/06/21 122/74    Physical Exam Vitals and nursing note reviewed.  Constitutional:      General: She is not in acute distress.    Appearance: She is well-developed.  HENT:     Head: Normocephalic and atraumatic.  Pulmonary:     Effort: Pulmonary effort is normal. No respiratory distress.  Skin:    General: Skin is warm and dry.     Findings: No rash.  Neurological:     Mental Status: She is alert and oriented to person, place, and time.  Psychiatric:        Mood and Affect: Mood normal.        Behavior: Behavior normal.     Wt Readings from Last 3 Encounters:  07/15/22 181 lb (82.1 kg)  04/09/22 181 lb 6.4 oz (82.3 kg)  02/24/22 174 lb (78.9 kg)    There were no vitals taken for this visit.  Assessment and Plan:  Problem List Items Addressed This Visit       Cardiovascular and Mediastinum   Coronary artery disease of native artery of native heart with stable angina pectoris (Chronic)    On atorvastatin Cardiology added Zetia in November for more LDL lowering      Essential (primary) hypertension - Primary (Chronic)    Clinically stable exam with well controlled BP on coreg, spironolactone, Entresto, and lasix. Tolerating medications without side effects. Pt to continue current regimen and low sodium diet. She is followed by  Cardiology at [facility].         Other   Pre-diabetes (Chronic)    Controlled with diet changes Lab Results  Component Value Date   HGBA1C 5.7 (H) 02/24/2022         No follow-ups on file.   Partially dictated using Dragon software, any errors are not intentional.  Reubin Milan, MD [facility] [place], [place]

## 2022-09-05 IMAGING — CR DG CHEST 2V
2 series · 2 of 2 positions shown · non-contrast
Comparison: Chest x-ray 01/04/2013

CLINICAL DATA: Shortness of breath

EXAM:
CHEST - 2 VIEW

[chest pa]
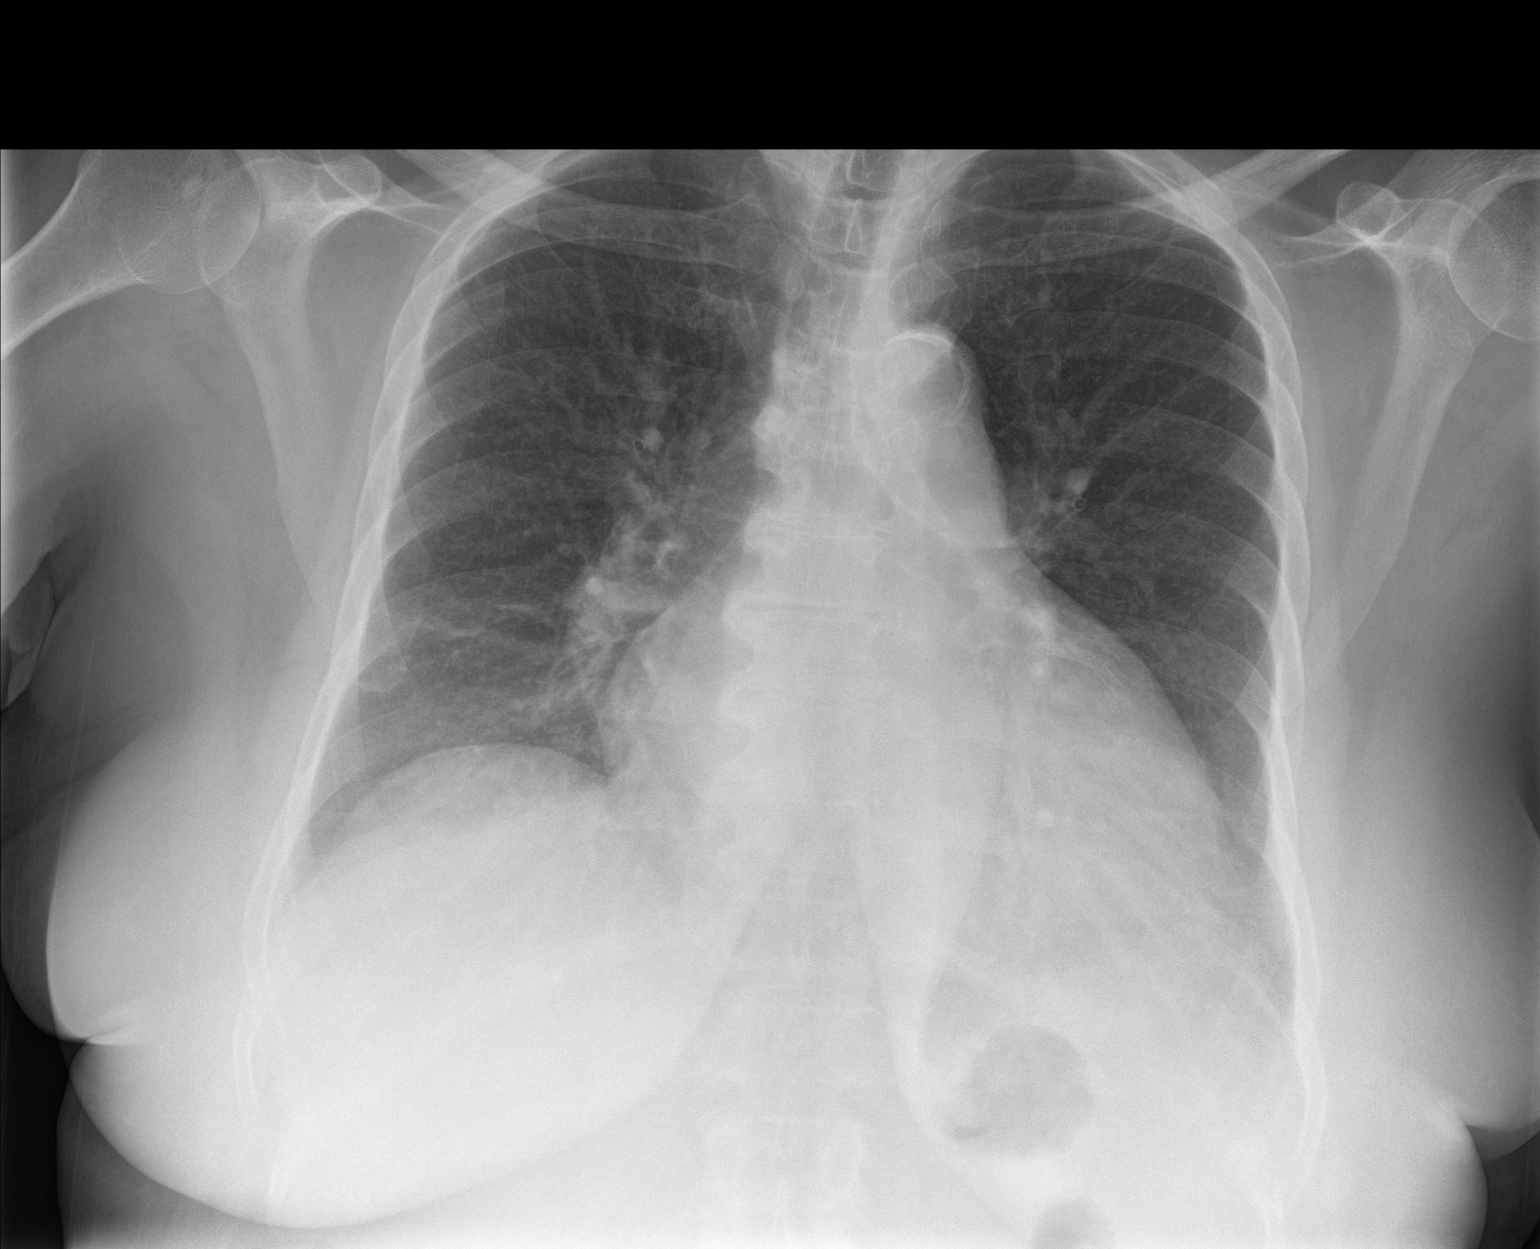

[chest lat]
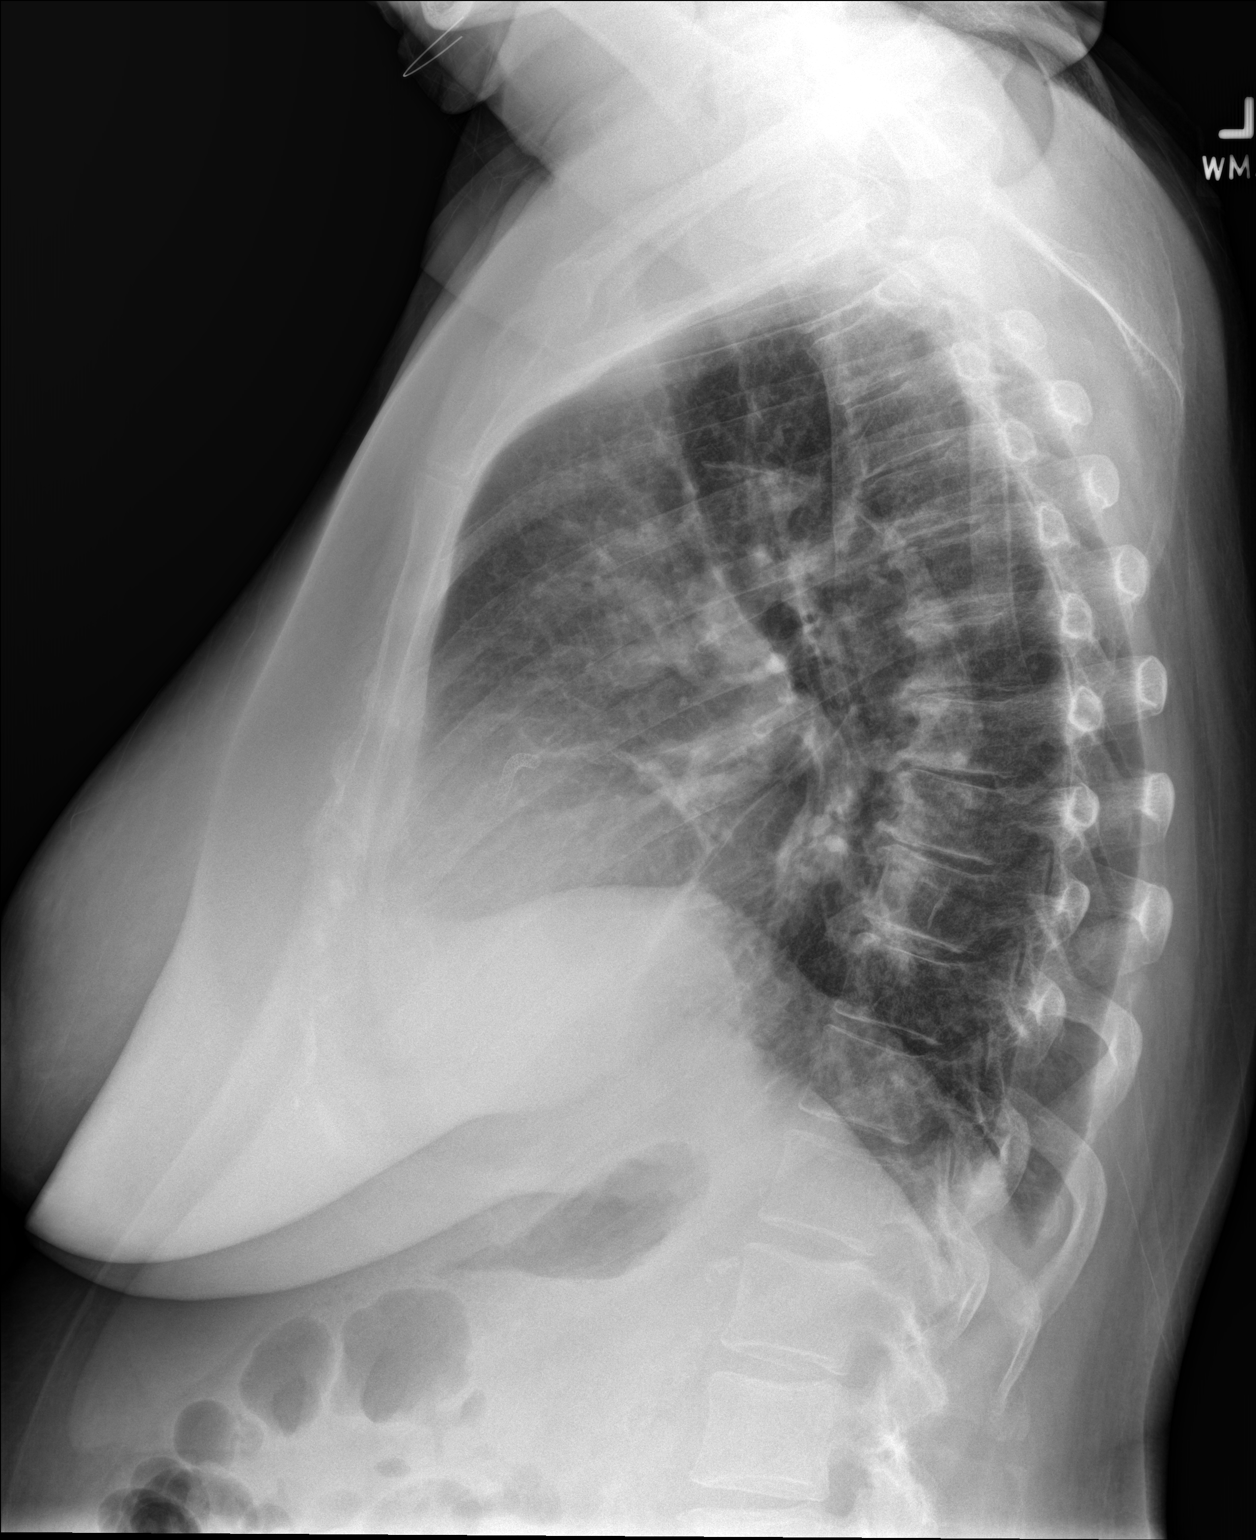

[2 of 2 positions shown; findings below may reference images not displayed]

FINDINGS: Heart is enlarged. Mediastinum appears stable. Calcified plaques in
the aortic arch. Pulmonary vasculature is normal. No focal
consolidation, pleural effusion or pneumothorax identified.
IMPRESSION: Cardiomegaly with no acute process identified.

## 2022-10-21 ENCOUNTER — Other Ambulatory Visit: Payer: Self-pay | Admitting: Internal Medicine

## 2022-10-21 NOTE — Telephone Encounter (Signed)
Medication Refill - Medication:   Entresto  Carvedilol 25mg  Jardiance  Nitroglycerin pt said some of these were by her cardiologist and there was a grant but she does not have the grant.   Has the patient contacted their pharmacy? Yes.  She said she did (Agent: If no, request that the patient contact the pharmacy for the refill. If patient does not wish to contact the pharmacy document the reason why and proceed with request.) (Agent: If yes, when and what did the pharmacy advise?)  Preferred Pharmacy (with phone number or street name): [facility]  [place] Has the patient been seen for an appointment in the last year OR does the patient have an upcoming appointment? Yes.    Agent: Please be advised that RX refills may take up to 3 business days. We ask that you follow-up with your pharmacy.

## 2022-10-22 MED ORDER — NITROGLYCERIN 0.4 MG SL SUBL: 0.4000 mg | SUBLINGUAL_TABLET | SUBLINGUAL | 1 refills | Status: DC | PRN

## 2022-10-22 NOTE — Telephone Encounter (Signed)
Requested medication (s) are due for refill today: routing for review  Requested medication (s) are on the active medication list: yes  Last refill:  multiple dates  Future visit scheduled: yes  Notes to clinic:  Unable to refill per protocol, last refill by another provider. Medications were prescribed by cardiologist, routing for approval.      Requested Prescriptions  Pending Prescriptions Disp Refills   nitroGLYCERIN (NITROSTAT) 0.4 MG SL tablet 25 tablet 1    Sig: Place 1 tablet (0.4 mg total) under the tongue every 5 (five) minutes as needed for chest pain.     Cardiovascular:  Nitrates Passed - 10/22/2022  8:04 AM      Passed - Last BP in normal range    BP Readings from Last 1 Encounters:  04/09/22 126/78         Passed - Last Heart Rate in normal range    Pulse Readings from Last 1 Encounters:  06/10/22 74         Passed - Valid encounter within last 12 months    Recent Outpatient Visits           4 months ago COVID   [facility] & [facility] Medicine at [facility], Ocie Bob, MD   6 months ago Viral upper respiratory tract infection   [place] [facility] & Sports Medicine at [facility], Nyoka Cowden, MD   8 months ago Annual physical exam   [facility] & [facility] Medicine at [facility], Nyoka Cowden, MD   11 months ago Pre-diabetes   [facility] & [facility] Medicine at [facility], Nyoka Cowden, MD   1 year ago Pneumonia due to COVID-19 virus   [facility] Primary Care & Sports Medicine at [facility], Nyoka Cowden, MD       Future Appointments             In 4 months Reubin Milan, MD [facility] Primary Care & Sports Medicine at [facility] [place], [facility]             carvedilol (COREG) 25 MG tablet      Sig: Take 1 tablet (25 mg total) by mouth 2 (two) times daily.     Cardiovascular: Beta Blockers 3 Failed - 10/22/2022  8:04 AM      Failed - Cr in normal range  and within 360 days    Creatinine, Ser  Date Value Ref Range Status  02/24/2022 1.38 (H) 0.57 - 1.00 mg/dL Final         Passed - AST in normal range and within 360 days    AST  Date Value Ref Range Status  02/24/2022 16 0 - 40 IU/L Final         Passed - ALT in normal range and within 360 days    ALT  Date Value Ref Range Status  02/24/2022 16 0 - 32 IU/L Final         Passed - Last BP in normal range    BP Readings from Last 1 Encounters:  04/09/22 126/78         Passed - Last Heart Rate in normal range    Pulse Readings from Last 1 Encounters:  06/10/22 74         Passed - Valid encounter within last 6 months    Recent Outpatient Visits           4 months  ago COVID   [facility] Care & Sports Medicine at [facility], Ocie Bob, MD   6 months ago Viral upper respiratory tract infection   [place] [facility] Care & Sports Medicine at [facility], Nyoka Cowden, MD   8 months ago Annual physical exam   [facility] Primary Care & Sports Medicine at [facility], Nyoka Cowden, MD   11 months ago Pre-diabetes   [facility] Primary Care & Sports [facility] at [facility], Nyoka Cowden, MD   1 year ago Pneumonia due to COVID-19 virus   [facility] Primary Care & Sports Medicine at [facility], Nyoka Cowden, MD       Future Appointments             In 4 months Reubin Milan, MD [facility] Primary Care & Sports Medicine at [facility] [place], [facility]             JARDIANCE 10 MG TABS tablet 30 tablet     Sig: Take 1 tablet (10 mg total) by mouth daily.     Endocrinology:  Diabetes - SGLT2 Inhibitors Failed - 10/22/2022  8:04 AM      Failed - Cr in normal range and within 360 days    Creatinine, Ser  Date Value Ref Range Status  02/24/2022 1.38 (H) 0.57 - 1.00 mg/dL Final         Failed - HBA1C is between 0 and 7.9 and within 180 days    Hemoglobin A1C  Date Value Ref Range Status  08/29/2019 6.3  Final    Hgb A1c MFr Bld  Date Value Ref Range Status  02/24/2022 5.7 (H) 4.8 - 5.6 % Final    Comment:             Prediabetes: 5.7 - 6.4          Diabetes: >6.4          Glycemic control for adults with diabetes: <7.0          Failed - eGFR in normal range and within 360 days    GFR calc Af Amer  Date Value Ref Range Status  10/04/2019 89 >59 mL/min/1.73 Final    Comment:    **[facility] currently reports eGFR in compliance with the current**   recommendations of the [employer]. [facility] will   update reporting as new guidelines are published from the NKF-ASN   Task force.    GFR calc non Af Amer  Date Value Ref Range Status  10/04/2019 78 >59 mL/min/1.73 Final   eGFR  Date Value Ref Range Status  02/24/2022 42 (L) >59 mL/min/1.73 Final         Passed - Valid encounter within last 6 months    Recent Outpatient Visits           4 months ago COVID   [facility] at [facility], Ocie Bob, MD   6 months ago Viral upper respiratory tract infection   [place] Primary Care & Sports Medicine at [facility], Nyoka Cowden, MD   8 months ago Annual physical exam   [facility] Care & Sports Medicine at [facility], Nyoka Cowden, MD   11 months ago Pre-diabetes   [facility] Primary Care & Sports Medicine at [facility], Nyoka Cowden, MD   1 year ago Pneumonia due to COVID-19 virus   [facility] Primary Care &  Sports Medicine at [facility], Nyoka Cowden, MD       Future Appointments             In 4 months Judithann Graves Nyoka Cowden, MD [facility] Primary Care & Sports Medicine at [facility], [facility]             sacubitril-valsartan (ENTRESTO) 49-51 MG 60 tablet     Sig: Take 1 tablet by mouth 2 (two) times daily.     Off-Protocol Failed - 10/22/2022  8:04 AM      Failed - Medication not assigned to a protocol, review manually.      Passed - Valid encounter within last 12  months    Recent Outpatient Visits           4 months ago COVID   [facility] Primary Care & Sports Medicine at [facility], Ocie Bob, MD   6 months ago Viral upper respiratory tract infection   [place] Primary Care & Sports Medicine at [facility], Nyoka Cowden, MD   8 months ago Annual physical exam   [facility] Primary Care & Sports Medicine at [facility], Nyoka Cowden, MD   11 months ago Pre-diabetes   [facility] Primary Care & Sports Medicine at [facility], Nyoka Cowden, MD   1 year ago Pneumonia due to COVID-19 virus   [facility] Primary Care & Sports Medicine at [facility], Nyoka Cowden, MD       Future Appointments             In 4 months Judithann Graves, Nyoka Cowden, MD [facility] Primary Care & Sports Medicine at [facility], [facility]

## 2022-10-22 NOTE — Addendum Note (Signed)
Addended by: Nena Polio on: 10/22/2022 08:04 AM   Modules accepted: Orders

## 2022-12-14 ENCOUNTER — Ambulatory Visit: Payer: Medicare Other | Admitting: Internal Medicine

## 2022-12-14 ENCOUNTER — Ambulatory Visit: Payer: Self-pay | Admitting: *Deleted

## 2022-12-14 NOTE — Telephone Encounter (Signed)
Summary:  Per agent: "Cough, low grade fever   The patient called in to cancel her appt with her provider today stating her car will not start. She has had a bad cough and a low grade fever. The patient states these are her only symptoms. Please assist patient further."          Chief Complaint: Cough Symptoms: Dry cough, mild wheezing last night. Temp.100.0 Sun through Mon Frequency: Sunday Pertinent Negatives: Patient denies SOB, sore throat, muscle aches Disposition: []ED /[]Urgent Care (no appt availability in office) / [x]Appointment(In office/virtual)/ [] [place] [facility]/ []Home Care/ []Refused Recommended Disposition /[] Mobile Bus/ [] Follow-up with PCP Additional Notes:  States has taken cough meds prescribed when she had covid in Feb, no longer effective. Cough keeping her awake at HS. Appt secured for tomorrow. Care advise provided, pt verbalizes understanding.   Reason for Disposition  [1] Continuous (nonstop) coughing interferes with work or school AND [2] no improvement using cough treatment per Care Advice  Answer Assessment - Initial Assessment Questions 1. ONSET: "When did the cough begin?"      Sunday 2. SEVERITY: "How bad is the cough today?"      Awake at night 3. SPUTUM: "Describe the color of your sputum" (none, dry cough; clear, white, yellow, green)     no 4. HEMOPTYSIS: "Are you coughing up any blood?" If so ask: "How much?" (flecks, streaks, tablespoons, etc.)     No 5. DIFFICULTY BREATHING: "Are you having difficulty breathing?" If Yes, ask: "How bad is it?" (e.g., mild, moderate, severe)    - MILD: No SOB at rest, mild SOB with walking, speaks normally in sentences, can lie down, no retractions, pulse < 100.    - MODERATE: SOB at rest, SOB with minimal exertion and prefers to sit, cannot lie down flat, speaks in phrases, mild retractions, audible wheezing, pulse 100-120.    - SEVERE: Very SOB at rest, speaks in single words, struggling  to breathe, sitting hunched forward, retractions, pulse > 120      No 6. FEVER: "Do you have a fever?" If Yes, ask: "What is your temperature, how was it measured, and when did it start?"     10 0.0  Sunday-Monday  10. OTHER SYMPTOMS: "Do you have any other symptoms?" (e.g., runny nose, wheezing, chest pain)       Slight runny nose, little wheeze last night.  Protocols used: Cough - Acute Non-Productive-A-AH

## 2022-12-15 ENCOUNTER — Ambulatory Visit (INDEPENDENT_AMBULATORY_CARE_PROVIDER_SITE_OTHER): Payer: Medicare Other | Admitting: Physician Assistant

## 2022-12-15 ENCOUNTER — Telehealth: Payer: Self-pay | Admitting: Internal Medicine

## 2022-12-15 ENCOUNTER — Encounter: Payer: Self-pay | Admitting: Physician Assistant

## 2022-12-15 VITALS — BP 142/90 | HR 89 | Temp 100.0°F | Ht 62.0 in | Wt 177.0 lb

## 2022-12-15 DIAGNOSIS — J069 Acute upper respiratory infection, unspecified: Secondary | ICD-10-CM

## 2022-12-15 DIAGNOSIS — R051 Acute cough: Secondary | ICD-10-CM

## 2022-12-15 MED ORDER — PROMETHAZINE-DM 6.25-15 MG/5ML PO SYRP
5.0000 mL | ORAL_SOLUTION | Freq: Four times a day (QID) | ORAL | 0 refills | Status: AC | PRN
Start: 2022-12-15 — End: 2022-12-24

## 2022-12-15 MED ORDER — BENZONATATE 200 MG PO CAPS
200.0000 mg | ORAL_CAPSULE | Freq: Three times a day (TID) | ORAL | 0 refills | Status: AC | PRN
Start: 2022-12-15 — End: 2022-12-25

## 2022-12-15 NOTE — Telephone Encounter (Signed)
Copied from CRM [id number]. Topic: General - Other >> Dec 15, 2022 12:48 PM Lennox Pippins wrote: Patient called and stated she had missed a call, and did not get a voicemail, please advise and contact patient back @ [phone]

## 2022-12-15 NOTE — Progress Notes (Deleted)
Date:  12/15/2022   Name:  Cassidy Dawson   DOB:  11-22-1955   MRN:  [id number]   Chief Complaint: Cough  HPI  Lab Results  Component Value Date   NA 142 02/24/2022   K 5.0 02/24/2022   CO2 26 02/24/2022   GLUCOSE 95 02/24/2022   BUN 25 02/24/2022   CREATININE 1.38 (H) 02/24/2022   CALCIUM 9.9 02/24/2022   EGFR 42 (L) 02/24/2022   GFRNONAA 78 10/04/2019   Lab Results  Component Value Date   CHOL 164 02/24/2022   HDL 36 (L) 02/24/2022   LDLCALC 101 (H) 02/24/2022   LDLDIRECT 84 02/24/2022   TRIG 155 (H) 02/24/2022   CHOLHDL 4.6 (H) 02/24/2022   Lab Results  Component Value Date   TSH 1.730 02/24/2022   Lab Results  Component Value Date   HGBA1C 5.7 (H) 02/24/2022   Lab Results  Component Value Date   WBC 7.1 02/24/2022   HGB 13.4 02/24/2022   HCT 40.6 02/24/2022   MCV 92 02/24/2022   PLT 403 02/24/2022   Lab Results  Component Value Date   ALT 16 02/24/2022   AST 16 02/24/2022   ALKPHOS 124 (H) 02/24/2022   BILITOT 0.5 02/24/2022   No results found for: "25OHVITD2", "25OHVITD3", "VD25OH"   Review of Systems  Patient Active Problem List   Diagnosis Date Noted   COVID 06/10/2022   Chronic kidney disease, stage 3a (HCC) 11/06/2021   Slow transit constipation 02/11/2021   Coronary artery disease of native artery of native heart with stable angina pectoris (HCC) 10/08/2020   Plantar wart 05/02/2018   Hypertriglyceridemia 05/01/2018   Pre-diabetes 05/01/2018   Acute on chronic congestive heart failure (HCC) 01/11/2017   History of non-ST elevation myocardial infarction (NSTEMI) 01/11/2017   Essential (primary) hypertension 08/14/2014   Allergic rhinitis, seasonal 08/14/2014    Allergies  Allergen Reactions   Ace Inhibitors Cough   Codeine Nausea And Vomiting    Past Surgical History:  Procedure Laterality Date   CORONARY ANGIOPLASTY WITH STENT PLACEMENT  01/2017   DES to LAD   UTERINE FIBROID SURGERY  1996    Social History    Tobacco Use   Smoking status: Never   Smokeless tobacco: Never  Vaping Use   Vaping status: Never Used  Substance Use Topics   Alcohol use: Never    Alcohol/week: 0.0 standard drinks of alcohol   Drug use: Never     Medication list has been reviewed and updated.  No outpatient medications have been marked as taking for the 12/15/22 encounter (Office Visit) with Remo Lipps, PA.       04/09/2022    9:25 AM 02/24/2022   11:06 AM 11/06/2021    3:17 PM 07/21/2021    2:04 PM  GAD 7 : Generalized Anxiety Score  Nervous, Anxious, on Edge 0 0 0 0  Control/stop worrying 0 0 0 0  Worry too much - different things 0 0 0 0  Trouble relaxing 0 0 0 0  Restless 0 0 0 0  Easily annoyed or irritable 0 0 0 0  Afraid - awful might happen 0 0 0 0  Total GAD 7 Score 0 0 0 0  Anxiety Difficulty Not difficult at all Not difficult at all Not difficult at all Not difficult at all       07/15/2022    9:33 AM 04/09/2022    9:25 AM 02/24/2022   11:06 AM  Depression screen  PHQ 2/9  Decreased Interest 0 0 0  Down, Depressed, Hopeless 0 0 0  PHQ - 2 Score 0 0 0  Altered sleeping 0 0 0  Tired, decreased energy 0 0 0  Change in appetite 0 0 0  Feeling bad or failure about yourself  0 0 0  Trouble concentrating 0 0 0  Moving slowly or fidgety/restless 0 0 0  Suicidal thoughts 0 0 0  PHQ-9 Score 0 0 0  Difficult doing work/chores Not difficult at all Not difficult at all Not difficult at all    BP Readings from Last 3 Encounters:  04/09/22 126/78  02/24/22 118/72  11/06/21 122/74    Physical Exam  Wt Readings from Last 3 Encounters:  07/15/22 181 lb (82.1 kg)  04/09/22 181 lb 6.4 oz (82.3 kg)  02/24/22 174 lb (78.9 kg)    Ht 5\' 2"  (1.575 m)   BMI 33.11 kg/m   Assessment and Plan:  Problem List Items Addressed This Visit   None   No follow-ups on file.    Reubin Milan, MD [facility] Primary Care and Sports Medicine [place]

## 2022-12-15 NOTE — Progress Notes (Signed)
Date:  12/15/2022   Name:  Cassidy Dawson   DOB:  1956/01/02   MRN:  [id number]   Chief Complaint: Cough (Coughing with wheezing. Started Sunday- 3 days ago. Dry Cough. Low grade fever. )  Cough Associated symptoms include a fever and wheezing. Pertinent negatives include no chest pain or shortness of breath.   Cassidy Dawson is a very pleasant 67 year old female new to me today, typically sees my colleague Dr. Bari Edward, MD for routine care, here for evaluation of URI symptoms as above for the last 3 days.  Most bothersome symptom is cough which is keeping her up at night, mild wheezing but not really productive.  She has had COVID twice in the past, states this does not feel quite the same.  Currently using some chronic cough syrup at home, which was given to her for COVID, without much benefit at present.   Medication list has been reviewed and updated.  Current Meds  Medication Sig   Ascorbic Acid (VITAMIN C PO) Take by mouth daily.   aspirin 81 MG chewable tablet Chew 81 mg by mouth daily.   atorvastatin (LIPITOR) 80 MG tablet Take 1 tablet (80 mg total) by mouth daily.   benzonatate (TESSALON) 200 MG capsule Take 1 capsule (200 mg total) by mouth 3 (three) times daily as needed for up to 10 days for cough.   carvedilol (COREG) 25 MG tablet Take 25 mg by mouth 2 (two) times daily.   cetirizine (ZYRTEC) 10 MG tablet Take 1 tablet (10 mg total) by mouth daily.   Cyanocobalamin (VITAMIN B-12 PO) Take by mouth.   Ergocalciferol 50 MCG (2000 UT) CAPS Take 1 capsule by mouth daily.   fluticasone (FLONASE) 50 MCG/ACT nasal spray Place 2 sprays into both nostrils daily.   furosemide (LASIX) 40 MG tablet Take 40 mg by mouth daily.   JARDIANCE 10 MG TABS tablet Take 10 mg by mouth daily.   nitroGLYCERIN (NITROSTAT) 0.4 MG SL tablet Place 1 tablet (0.4 mg total) under the tongue every 5 (five) minutes as needed for chest pain.   promethazine-dextromethorphan (PROMETHAZINE-DM)  6.25-15 MG/5ML syrup Take 5 mLs by mouth 4 (four) times daily as needed for up to 9 days for cough.   sacubitril-valsartan (ENTRESTO) 49-51 MG Take 1 tablet by mouth 2 (two) times daily.   spironolactone (ALDACTONE) 25 MG tablet Take 25 mg by mouth daily.     Review of Systems  Constitutional:  Positive for fatigue and fever.  Respiratory:  Positive for cough and wheezing. Negative for chest tightness and shortness of breath.   Cardiovascular:  Negative for chest pain and palpitations.  Gastrointestinal:  Negative for abdominal pain.    Patient Active Problem List   Diagnosis Date Noted   COVID 06/10/2022   Chronic kidney disease, stage 3a (HCC) 11/06/2021   Slow transit constipation 02/11/2021   Coronary artery disease of native artery of native heart with stable angina pectoris (HCC) 10/08/2020   Plantar wart 05/02/2018   Hypertriglyceridemia 05/01/2018   Pre-diabetes 05/01/2018   Acute on chronic congestive heart failure (HCC) 01/11/2017   History of non-ST elevation myocardial infarction (NSTEMI) 01/11/2017   Essential (primary) hypertension 08/14/2014   Allergic rhinitis, seasonal 08/14/2014    Allergies  Allergen Reactions   Ace Inhibitors Cough   Codeine Nausea And Vomiting    Immunization History  Administered Date(s) Administered   COVID-19, mRNA, vaccine(Comirnaty)12 years and older 02/24/2022   Fluad Quad(high Dose 65+) 01/14/2021, 02/24/2022  Influenza,inj,Quad PF,6+ Mos 06/28/2016, 05/02/2018, 01/22/2019   Janssen (J&J) SARS-COV-2 Vaccination 07/14/2019   PFIZER(Purple Top)SARS-COV-2 Vaccination 02/01/2020, 06/12/2021   Pneumococcal Conjugate-13 10/04/2019   Pneumococcal Polysaccharide-23 10/08/2020   Tdap 06/12/2021    Past Surgical History:  Procedure Laterality Date   CORONARY ANGIOPLASTY WITH STENT PLACEMENT  01/2017   DES to LAD   UTERINE FIBROID SURGERY  1996    Social History   Tobacco Use   Smoking status: Never   Smokeless tobacco: Never   Vaping Use   Vaping status: Never Used  Substance Use Topics   Alcohol use: Never    Alcohol/week: 0.0 standard drinks of alcohol   Drug use: Never    Family History  Problem Relation Age of Onset   Hypertension Father    Kidney disease Father    Breast cancer Mother    Hypertension Brother    Renal Disease Brother         12/15/2022   10:56 AM 04/09/2022    9:25 AM 02/24/2022   11:06 AM 11/06/2021    3:17 PM  GAD 7 : Generalized Anxiety Score  Nervous, Anxious, on Edge 0 0 0 0  Control/stop worrying 0 0 0 0  Worry too much - different things 0 0 0 0  Trouble relaxing 0 0 0 0  Restless 0 0 0 0  Easily annoyed or irritable 0 0 0 0  Afraid - awful might happen 0 0 0 0  Total GAD 7 Score 0 0 0 0  Anxiety Difficulty Not difficult at all Not difficult at all Not difficult at all Not difficult at all       12/15/2022   10:56 AM 07/15/2022    9:33 AM 04/09/2022    9:25 AM  Depression screen PHQ 2/9  Decreased Interest 0 0 0  Down, Depressed, Hopeless 0 0 0  PHQ - 2 Score 0 0 0  Altered sleeping 0 0 0  Tired, decreased energy 0 0 0  Change in appetite 0 0 0  Feeling bad or failure about yourself  0 0 0  Trouble concentrating 0 0 0  Moving slowly or fidgety/restless 0 0 0  Suicidal thoughts 0 0 0  PHQ-9 Score 0 0 0  Difficult doing work/chores Not difficult at all Not difficult at all Not difficult at all    BP Readings from Last 3 Encounters:  12/15/22 (!) 152/86  04/09/22 126/78  02/24/22 118/72    Wt Readings from Last 3 Encounters:  12/15/22 177 lb (80.3 kg)  07/15/22 181 lb (82.1 kg)  04/09/22 181 lb 6.4 oz (82.3 kg)    BP (!) 152/86   Pulse 89   Temp 100 F (37.8 C) (Oral)   Ht 5\' 2"  (1.575 m)   Wt 177 lb (80.3 kg)   SpO2 95%   BMI 32.37 kg/m   Physical Exam Vitals and nursing note reviewed.  Constitutional:      General: She is not in acute distress.    Appearance: Normal appearance.  HENT:     Right Ear: Tympanic membrane normal.     Left  Ear: Tympanic membrane normal.     Ears:     Comments: EAC clear bilaterally with good view of TM which is without effusion or erythema.     Nose: Nose normal.     Comments: Sinuses nontender    Mouth/Throat:     Mouth: Mucous membranes are moist.     Pharynx: No oropharyngeal exudate or  posterior oropharyngeal erythema.  Eyes:     Conjunctiva/sclera: Conjunctivae normal.     Pupils: Pupils are equal, round, and reactive to light.  Neck:     Vascular: No carotid bruit.  Cardiovascular:     Rate and Rhythm: Normal rate and regular rhythm.     Heart sounds: No murmur heard.    No friction rub. No gallop.  Pulmonary:     Effort: Pulmonary effort is normal.     Breath sounds: Wheezing (mild) present. No rhonchi or rales.  Lymphadenopathy:     Cervical: No cervical adenopathy.     Recent Labs     Component Value Date/Time   NA 142 02/24/2022 1202   K 5.0 02/24/2022 1202   CL 102 02/24/2022 1202   CO2 26 02/24/2022 1202   GLUCOSE 95 02/24/2022 1202   BUN 25 02/24/2022 1202   CREATININE 1.38 (H) 02/24/2022 1202   CALCIUM 9.9 02/24/2022 1202   PROT 7.7 02/24/2022 1202   ALBUMIN 4.6 02/24/2022 1202   AST 16 02/24/2022 1202   ALT 16 02/24/2022 1202   ALKPHOS 124 (H) 02/24/2022 1202   BILITOT 0.5 02/24/2022 1202   GFRNONAA 78 10/04/2019 0926   GFRAA 89 10/04/2019 0926    Lab Results  Component Value Date   WBC 7.1 02/24/2022   HGB 13.4 02/24/2022   HCT 40.6 02/24/2022   MCV 92 02/24/2022   PLT 403 02/24/2022   Lab Results  Component Value Date   HGBA1C 5.7 (H) 02/24/2022   Lab Results  Component Value Date   CHOL 164 02/24/2022   HDL 36 (L) 02/24/2022   LDLCALC 101 (H) 02/24/2022   LDLDIRECT 84 02/24/2022   TRIG 155 (H) 02/24/2022   CHOLHDL 4.6 (H) 02/24/2022   Lab Results  Component Value Date   TSH 1.730 02/24/2022     Assessment and Plan:  1. Acute URI Likely viral etiology.  COVID test negative today.  Discussed self-limited nature of viral  illnesses and advised conservative measures including rest, fluids, and cough medications as below.  Advised that Promethazine DM may cause drowsiness.  Contact precautions advised to limit spread. Encouraged mask wearing and good hand hygiene especially before meals. Call if acutely worsening symptoms or if no improvement in 5 days  - promethazine-dextromethorphan (PROMETHAZINE-DM) 6.25-15 MG/5ML syrup; Take 5 mLs by mouth 4 (four) times daily as needed for up to 9 days for cough.  Dispense: 118 mL; Refill: 0 - benzonatate (TESSALON) 200 MG capsule; Take 1 capsule (200 mg total) by mouth 3 (three) times daily as needed for up to 10 days for cough.  Dispense: 30 capsule; Refill: 0  2. Acute cough Plan as above - promethazine-dextromethorphan (PROMETHAZINE-DM) 6.25-15 MG/5ML syrup; Take 5 mLs by mouth 4 (four) times daily as needed for up to 9 days for cough.  Dispense: 118 mL; Refill: 0 - benzonatate (TESSALON) 200 MG capsule; Take 1 capsule (200 mg total) by mouth 3 (three) times daily as needed for up to 10 days for cough.  Dispense: 30 capsule; Refill: 0    F/u PRN   Alvester Morin, PA-C, DMSc, Nutritionist [facility] Primary Care and Sports Medicine [facility] [phone]

## 2023-01-05 ENCOUNTER — Ambulatory Visit: Payer: Self-pay | Admitting: *Deleted

## 2023-01-05 NOTE — Telephone Encounter (Signed)
Summary: lingering cough   Dry Cough  2.5 weeks,  medication helped a little but cough is still ongoing. Please advise          Attempted to reach pt, VM full, unable to leave message to call back.

## 2023-01-05 NOTE — Telephone Encounter (Signed)
Second attempt to reach pt, VM is full, unable to leave message to call back.

## 2023-01-05 NOTE — Telephone Encounter (Signed)
Several attempts to reach pt, VM not set up. Routing to practice to PCP's resolution per protocol.  Please advise.

## 2023-02-26 ENCOUNTER — Other Ambulatory Visit: Payer: Self-pay | Admitting: Internal Medicine

## 2023-02-28 NOTE — Progress Notes (Deleted)
Date:  03/01/2023   Name:  Cassidy Dawson   DOB:  [birth year]   MRN:  [id number]   Chief Complaint: No chief complaint on file. Cassidy Dawson is a 67 y.o. female who presents today for her Complete Annual Exam. She feels {DESC; WELL/FAIRLY WELL/POORLY:18703}. She reports exercising ***. She reports she is sleeping {DESC; WELL/FAIRLY WELL/POORLY:18703}. Breast complaints ***.  Mammogram: 07/2022  DEXA: 08/2020 Colonoscopy: none - cancelled earlier this year  Health Maintenance Due  Topic Date Due   Zoster Vaccines- Shingrix (1 of 2) Never done   Colonoscopy  Never done   MAMMOGRAM  08/12/2021   INFLUENZA VACCINE  12/02/2022   COVID-19 Vaccine (5 - 2023-24 season) 01/02/2023    Immunization History  Administered Date(s) Administered   Fluad Quad(high Dose 65+) 01/14/2021, 02/24/2022   Influenza,inj,Quad PF,6+ Mos 06/28/2016, 05/02/2018, 01/22/2019   Janssen (J&J) SARS-COV-2 Vaccination 07/14/2019   PFIZER(Purple Top)SARS-COV-2 Vaccination 02/01/2020, 06/12/2021   Pfizer(Comirnaty)Fall Seasonal Vaccine 12 years and older 02/24/2022   Pneumococcal Conjugate-13 10/04/2019   Pneumococcal Polysaccharide-23 10/08/2020   Tdap 06/12/2021     Hypertension  Hyperlipidemia  Diabetes    Review of Systems   Lab Results  Component Value Date   NA 142 02/24/2022   K 5.0 02/24/2022   CO2 26 02/24/2022   GLUCOSE 95 02/24/2022   BUN 25 02/24/2022   CREATININE 1.38 (H) 02/24/2022   CALCIUM 9.9 02/24/2022   EGFR 42 (L) 02/24/2022   GFRNONAA 78 10/04/2019   Lab Results  Component Value Date   CHOL 164 02/24/2022   HDL 36 (L) 02/24/2022   LDLCALC 101 (H) 02/24/2022   LDLDIRECT 84 02/24/2022   TRIG 155 (H) 02/24/2022   CHOLHDL 4.6 (H) 02/24/2022   Lab Results  Component Value Date   TSH 1.730 02/24/2022   Lab Results  Component Value Date   HGBA1C 5.7 (H) 02/24/2022   Lab Results  Component Value Date   WBC 7.1 02/24/2022   HGB 13.4 02/24/2022    HCT 40.6 02/24/2022   MCV 92 02/24/2022   PLT 403 02/24/2022   Lab Results  Component Value Date   ALT 16 02/24/2022   AST 16 02/24/2022   ALKPHOS 124 (H) 02/24/2022   BILITOT 0.5 02/24/2022   No results found for: "25OHVITD2", "25OHVITD3", "VD25OH"   Patient Active Problem List   Diagnosis Date Noted   Chronic kidney disease, stage 3a (HCC) 11/06/2021   Slow transit constipation 02/11/2021   Coronary artery disease of native artery of native heart with stable angina pectoris (HCC) 10/08/2020   Plantar wart 05/02/2018   Hypertriglyceridemia 05/01/2018   Pre-diabetes 05/01/2018   Acute on chronic congestive heart failure (HCC) 01/11/2017   History of non-ST elevation myocardial infarction (NSTEMI) 01/11/2017   Essential (primary) hypertension 08/14/2014   Allergic rhinitis, seasonal 08/14/2014    Allergies  Allergen Reactions   Ace Inhibitors Cough   Codeine Nausea And Vomiting    Past Surgical History:  Procedure Laterality Date   CORONARY ANGIOPLASTY WITH STENT PLACEMENT  01/2017   DES to LAD   UTERINE FIBROID SURGERY  1996    Social History   Tobacco Use   Smoking status: Never   Smokeless tobacco: Never  Vaping Use   Vaping status: Never Used  Substance Use Topics   Alcohol use: Never    Alcohol/week: 0.0 standard drinks of alcohol   Drug use: Never     Medication list has been reviewed and updated.  No outpatient  medications have been marked as taking for the 03/01/23 encounter (Appointment) with Reubin Milan, MD.       12/15/2022   10:56 AM 04/09/2022    9:25 AM 02/24/2022   11:06 AM 11/06/2021    3:17 PM  GAD 7 : Generalized Anxiety Score  Nervous, Anxious, on Edge 0 0 0 0  Control/stop worrying 0 0 0 0  Worry too much - different things 0 0 0 0  Trouble relaxing 0 0 0 0  Restless 0 0 0 0  Easily annoyed or irritable 0 0 0 0  Afraid - awful might happen 0 0 0 0  Total GAD 7 Score 0 0 0 0  Anxiety Difficulty Not difficult at all Not  difficult at all Not difficult at all Not difficult at all       12/15/2022   10:56 AM 07/15/2022    9:33 AM 04/09/2022    9:25 AM  Depression screen PHQ 2/9  Decreased Interest 0 0 0  Down, Depressed, Hopeless 0 0 0  PHQ - 2 Score 0 0 0  Altered sleeping 0 0 0  Tired, decreased energy 0 0 0  Change in appetite 0 0 0  Feeling bad or failure about yourself  0 0 0  Trouble concentrating 0 0 0  Moving slowly or fidgety/restless 0 0 0  Suicidal thoughts 0 0 0  PHQ-9 Score 0 0 0  Difficult doing work/chores Not difficult at all Not difficult at all Not difficult at all    BP Readings from Last 3 Encounters:  12/15/22 (!) 142/90  04/09/22 126/78  02/24/22 118/72    Physical Exam  Wt Readings from Last 3 Encounters:  12/15/22 177 lb (80.3 kg)  07/15/22 181 lb (82.1 kg)  04/09/22 181 lb 6.4 oz (82.3 kg)    There were no vitals taken for this visit.  Assessment and Plan:  Problem List Items Addressed This Visit       Unprioritized   Chronic kidney disease, stage 3a (HCC) (Chronic)   Essential (primary) hypertension (Chronic)   Hypertriglyceridemia (Chronic)   Pre-diabetes (Chronic)   Other Visit Diagnoses     Annual physical exam    -  Primary   Colon cancer screening           No follow-ups on file.    Reubin Milan, MD [facility] [place]

## 2023-03-01 ENCOUNTER — Ambulatory Visit: Payer: Medicare Other | Admitting: Internal Medicine

## 2023-03-01 ENCOUNTER — Telehealth: Payer: Self-pay | Admitting: Internal Medicine

## 2023-03-01 DIAGNOSIS — N1831 Chronic kidney disease, stage 3a: Secondary | ICD-10-CM

## 2023-03-01 DIAGNOSIS — E781 Pure hyperglyceridemia: Secondary | ICD-10-CM

## 2023-03-01 DIAGNOSIS — Z0000 Encounter for general adult medical examination without abnormal findings: Secondary | ICD-10-CM

## 2023-03-01 DIAGNOSIS — I10 Essential (primary) hypertension: Secondary | ICD-10-CM

## 2023-03-01 DIAGNOSIS — R7303 Prediabetes: Secondary | ICD-10-CM

## 2023-03-01 DIAGNOSIS — Z1211 Encounter for screening for malignant neoplasm of colon: Secondary | ICD-10-CM

## 2023-03-01 NOTE — Telephone Encounter (Signed)
Left voice mail to reschedule apt.

## 2023-03-25 ENCOUNTER — Other Ambulatory Visit: Payer: Self-pay | Admitting: Internal Medicine

## 2023-03-25 DIAGNOSIS — E781 Pure hyperglyceridemia: Secondary | ICD-10-CM

## 2023-03-28 ENCOUNTER — Other Ambulatory Visit: Payer: Self-pay | Admitting: Internal Medicine

## 2023-03-28 DIAGNOSIS — E781 Pure hyperglyceridemia: Secondary | ICD-10-CM

## 2023-03-28 NOTE — Telephone Encounter (Signed)
Requested medication (s) are due for refill today: yes  Requested medication (s) are on the active medication list: med ended 02/25/23  Last refill:  02/24/22  Future visit scheduled: no  Notes to clinic:  prescription ended- needs appt   Requested Prescriptions  Pending Prescriptions Disp Refills   atorvastatin (LIPITOR) 80 MG tablet [Pharmacy Med Name: ATORVASTATIN 80MG  TABLETS] 90 tablet 3    Sig: TAKE 1 TABLET(80 MG) BY MOUTH DAILY     Cardiovascular:  Antilipid - Statins Failed - 03/25/2023  6:21 AM      Failed - Lipid Panel in normal range within the last 12 months    Cholesterol, Total  Date Value Ref Range Status  02/24/2022 164 100 - 199 mg/dL Final   LDL Chol Calc (NIH)  Date Value Ref Range Status  02/24/2022 101 (H) 0 - 99 mg/dL Final   LDL Direct  Date Value Ref Range Status  02/24/2022 84 0 - 99 mg/dL Final   HDL  Date Value Ref Range Status  02/24/2022 36 (L) >39 mg/dL Final   Triglycerides  Date Value Ref Range Status  02/24/2022 155 (H) 0 - 149 mg/dL Final         Passed - Patient is not pregnant      Passed - Valid encounter within last 12 months    Recent Outpatient Visits           3 months ago Acute URI   [place] [facility] at [facility] [place] Mordecai Maes, Melton Alar, [place]   9 months ago COVID   [facility] at [facility] Ashley Royalty, Ocie Bob, MD   11 months ago Viral upper respiratory tract infection   [place] [facility] at [facility], Nyoka Cowden, MD   1 year ago Annual physical exam   [facility] at [facility], Nyoka Cowden, [place]   1 year ago Pre-diabetes   [facility] at [facility], Nyoka Cowden, MD

## 2023-03-29 NOTE — Telephone Encounter (Signed)
Requested Prescriptions  Refused Prescriptions Disp Refills   atorvastatin (LIPITOR) 80 MG tablet [Pharmacy Med Name: ATORVASTATIN 80MG  TABLETS] 90 tablet     Sig: TAKE 1 TABLET(80 MG) BY MOUTH DAILY     Cardiovascular:  Antilipid - Statins Failed - 03/28/2023  4:40 PM      Failed - Lipid Panel in normal range within the last 12 months    Cholesterol, Total  Date Value Ref Range Status  02/24/2022 164 100 - 199 mg/dL Final   LDL Chol Calc (NIH)  Date Value Ref Range Status  02/24/2022 101 (H) 0 - 99 mg/dL Final   LDL Direct  Date Value Ref Range Status  02/24/2022 84 0 - 99 mg/dL Final   HDL  Date Value Ref Range Status  02/24/2022 36 (L) >39 mg/dL Final   Triglycerides  Date Value Ref Range Status  02/24/2022 155 (H) 0 - 149 mg/dL Final         Passed - Patient is not pregnant      Passed - Valid encounter within last 12 months    Recent Outpatient Visits           3 months ago Acute URI   [place] [facility] at [facility], Melton Alar, [place]   9 months ago COVID   [facility] at [facility] Royalty, Ocie Bob, MD   11 months ago Viral upper respiratory tract infection   [place] [facility] at [facility], Nyoka Cowden, [place]   1 year ago Annual physical exam   [facility] at [facility], Nyoka Cowden, MD   1 year ago Pre-diabetes   [facility] at [facility], Nyoka Cowden, [place]

## 2023-05-03 ENCOUNTER — Other Ambulatory Visit: Payer: Self-pay

## 2023-05-03 ENCOUNTER — Ambulatory Visit: Payer: Medicare Other | Admitting: Internal Medicine

## 2023-05-03 ENCOUNTER — Encounter: Payer: Self-pay | Admitting: Internal Medicine

## 2023-05-03 VITALS — BP 124/70 | HR 68 | Temp 98.4°F | Ht 62.0 in | Wt 177.0 lb

## 2023-05-03 DIAGNOSIS — N1831 Chronic kidney disease, stage 3a: Secondary | ICD-10-CM

## 2023-05-03 DIAGNOSIS — I10 Essential (primary) hypertension: Secondary | ICD-10-CM

## 2023-05-03 DIAGNOSIS — I5043 Acute on chronic combined systolic (congestive) and diastolic (congestive) heart failure: Secondary | ICD-10-CM | POA: Diagnosis not present

## 2023-05-03 DIAGNOSIS — J069 Acute upper respiratory infection, unspecified: Secondary | ICD-10-CM

## 2023-05-03 DIAGNOSIS — E781 Pure hyperglyceridemia: Secondary | ICD-10-CM | POA: Diagnosis not present

## 2023-05-03 DIAGNOSIS — I25118 Atherosclerotic heart disease of native coronary artery with other forms of angina pectoris: Secondary | ICD-10-CM

## 2023-05-03 DIAGNOSIS — R7303 Prediabetes: Secondary | ICD-10-CM | POA: Diagnosis not present

## 2023-05-03 LAB — POCT INFLUENZA A/B
Influenza A, POC: NEGATIVE
Influenza B, POC: NEGATIVE

## 2023-05-03 LAB — POC COVID19 BINAXNOW: SARS Coronavirus 2 Ag: NEGATIVE

## 2023-05-03 MED ORDER — BENZONATATE 100 MG PO CAPS: 100.0000 mg | ORAL_CAPSULE | Freq: Three times a day (TID) | ORAL | 0 refills | Status: AC

## 2023-05-03 NOTE — Assessment & Plan Note (Signed)
Last GFR > 14 mo ago. Patient has not been compliant with follow up. Last GFR 42  01/2022

## 2023-05-03 NOTE — Assessment & Plan Note (Signed)
 Controlled BP with normal exam. Current regimen is Coreg, Entresto, spironolactone, Jardiance and lasix. Will continue same medications; encourage continued reduced sodium diet.

## 2023-05-03 NOTE — Assessment & Plan Note (Signed)
Overdue for Cardiology follow up - not seen in more than one year Medications being refilled by Cardiology

## 2023-05-03 NOTE — Progress Notes (Signed)
 Date:  05/03/2023   Name:  Cassidy Dawson   DOB:  [birth year]   MRN:  [id number]   Chief Complaint: Cough (Cough X 1 day. Fever last night of 101. Body aches. No SOB.)  Cough This is a new problem. The current episode started yesterday. The problem has been unchanged. The problem occurs every few minutes. The cough is Non-productive. Associated symptoms include a fever and postnasal drip. Pertinent negatives include no chills, ear pain, headaches, sore throat, shortness of breath, sweats or wheezing. The symptoms are aggravated by lying down. She has tried nothing for the symptoms.  Hypertension This is a chronic problem. Pertinent negatives include no headaches, shortness of breath or sweats. Past treatments include beta blockers, angiotensin blockers and diuretics (Jardiance ). Hypertensive end-organ damage includes CAD/MI and heart failure.  Diabetes She presents for her follow-up diabetic visit. Diabetes type: prediabetes. Pertinent negatives for hypoglycemia include no dizziness, headaches, nervousness/anxiousness or sweats.  Hyperlipidemia This is a chronic problem. Pertinent negatives include no shortness of breath. Current antihyperlipidemic treatment includes statins and ezetimibe.  CKD - stage 3b; not followed by nephrology.  No labs in 14 mo.   Review of Systems  Constitutional:  Positive for fever. Negative for chills.  HENT:  Positive for postnasal drip. Negative for ear pain, sore throat and trouble swallowing.   Respiratory:  Positive for cough. Negative for shortness of breath and wheezing.   Gastrointestinal:  Negative for abdominal pain, constipation, diarrhea and vomiting.  Neurological:  Negative for dizziness, light-headedness and headaches.  Psychiatric/Behavioral:  Positive for sleep disturbance. Negative for dysphoric mood. The patient is not nervous/anxious.      Lab Results  Component Value Date   NA 142 02/24/2022   K 5.0 02/24/2022   CO2 26  02/24/2022   GLUCOSE 95 02/24/2022   BUN 25 02/24/2022   CREATININE 1.38 (H) 02/24/2022   CALCIUM  9.9 02/24/2022   EGFR 42 (L) 02/24/2022   GFRNONAA 78 10/04/2019   Lab Results  Component Value Date   CHOL 164 02/24/2022   HDL 36 (L) 02/24/2022   LDLCALC 101 (H) 02/24/2022   LDLDIRECT 84 02/24/2022   TRIG 155 (H) 02/24/2022   CHOLHDL 4.6 (H) 02/24/2022   Lab Results  Component Value Date   TSH 1.730 02/24/2022   Lab Results  Component Value Date   HGBA1C 5.7 (H) 02/24/2022   Lab Results  Component Value Date   WBC 7.1 02/24/2022   HGB 13.4 02/24/2022   HCT 40.6 02/24/2022   MCV 92 02/24/2022   PLT 403 02/24/2022   Lab Results  Component Value Date   ALT 16 02/24/2022   AST 16 02/24/2022   ALKPHOS 124 (H) 02/24/2022   BILITOT 0.5 02/24/2022   No results found for: MARIEN BOLLS, VD25OH   Patient Active Problem List   Diagnosis Date Noted   Chronic kidney disease, stage 3a (HCC) 11/06/2021   Slow transit constipation 02/11/2021   Coronary artery disease of native artery of native heart with stable angina pectoris (HCC) 10/08/2020   Plantar wart 05/02/2018   Hypertriglyceridemia 05/01/2018   Pre-diabetes 05/01/2018   Acute on chronic congestive heart failure (HCC) 01/11/2017   History of non-ST elevation myocardial infarction (NSTEMI) 01/11/2017   Essential (primary) hypertension 08/14/2014   Allergic rhinitis, seasonal 08/14/2014    Allergies  Allergen Reactions   Ace Inhibitors Cough   Codeine Nausea And Vomiting    Past Surgical History:  Procedure Laterality Date   CORONARY ANGIOPLASTY WITH  STENT PLACEMENT  01/2017   DES to LAD   UTERINE FIBROID SURGERY  1996    Social History   Tobacco Use   Smoking status: Never   Smokeless tobacco: Never  Vaping Use   Vaping status: Never Used  Substance Use Topics   Alcohol use: Never    Alcohol/week: 0.0 standard drinks of alcohol   Drug use: Never     Medication list has been  reviewed and updated.  Current Meds  Medication Sig   Ascorbic Acid (VITAMIN C PO) Take by mouth daily.   aspirin 81 MG chewable tablet Chew 81 mg by mouth daily.   atorvastatin  (LIPITOR) 80 MG tablet TAKE 1 TABLET(80 MG) BY MOUTH DAILY   benzonatate  (TESSALON ) 100 MG capsule Take 1 capsule (100 mg total) by mouth 3 (three) times daily for 10 days.   carvedilol  (COREG ) 25 MG tablet Take 25 mg by mouth 2 (two) times daily.   cetirizine  (ZYRTEC ) 10 MG tablet Take 1 tablet (10 mg total) by mouth daily.   Cyanocobalamin (VITAMIN B-12 PO) Take by mouth.   Ergocalciferol 50 MCG (2000 UT) CAPS Take 1 capsule by mouth daily.   ezetimibe (ZETIA) 10 MG tablet Take 10 mg by mouth daily.   fluticasone  (FLONASE ) 50 MCG/ACT nasal spray Place 2 sprays into both nostrils daily.   furosemide  (LASIX ) 40 MG tablet Take 40 mg by mouth daily.   JARDIANCE  10 MG TABS tablet Take 10 mg by mouth daily.   nitroGLYCERIN  (NITROSTAT ) 0.4 MG SL tablet PLACE 1 TABLET UNDER THE TONGUE EVERY 5 MINUTES AS NEEDED FOR CHEST PAIN. MAX OF 3 DOSES IN 15 MINUTES, IF PAIN CONTINUES CALL 911   sacubitril -valsartan  (ENTRESTO ) 49-51 MG Take 1 tablet by mouth 2 (two) times daily.   spironolactone (ALDACTONE) 25 MG tablet Take 25 mg by mouth daily.       05/03/2023    2:08 PM 12/15/2022   10:56 AM 04/09/2022    9:25 AM 02/24/2022   11:06 AM  GAD 7 : Generalized Anxiety Score  Nervous, Anxious, on Edge 0 0 0 0  Control/stop worrying 0 0 0 0  Worry too much - different things 0 0 0 0  Trouble relaxing 0 0 0 0  Restless 0 0 0 0  Easily annoyed or irritable 0 0 0 0  Afraid - awful might happen 0 0 0 0  Total GAD 7 Score 0 0 0 0  Anxiety Difficulty Not difficult at all Not difficult at all Not difficult at all Not difficult at all       05/03/2023    2:08 PM 12/15/2022   10:56 AM 07/15/2022    9:33 AM  Depression screen PHQ 2/9  Decreased Interest 0 0 0  Down, Depressed, Hopeless 0 0 0  PHQ - 2 Score 0 0 0  Altered  sleeping 0 0 0  Tired, decreased energy 0 0 0  Change in appetite 0 0 0  Feeling bad or failure about yourself  0 0 0  Trouble concentrating 0 0 0  Moving slowly or fidgety/restless 0 0 0  Suicidal thoughts 0 0 0  PHQ-9 Score 0 0 0  Difficult doing work/chores Not difficult at all Not difficult at all Not difficult at all    BP Readings from Last 3 Encounters:  05/03/23 124/70  12/15/22 (!) 142/90  04/09/22 126/78    Physical Exam Vitals and nursing note reviewed.  Constitutional:      General: She is  not in acute distress.    Appearance: Normal appearance. She is well-developed.  HENT:     Head: Normocephalic and atraumatic.     Right Ear: Tympanic membrane normal.     Left Ear: Tympanic membrane normal.     Nose:     Right Sinus: No maxillary sinus tenderness or frontal sinus tenderness.     Left Sinus: No maxillary sinus tenderness or frontal sinus tenderness.  Cardiovascular:     Rate and Rhythm: Normal rate and regular rhythm.  Pulmonary:     Effort: Pulmonary effort is normal. No respiratory distress.     Breath sounds: No wheezing or rhonchi.  Musculoskeletal:     Cervical back: Normal range of motion.     Right lower leg: No edema.     Left lower leg: No edema.  Lymphadenopathy:     Cervical: No cervical adenopathy.  Skin:    General: Skin is warm and dry.     Findings: No rash.  Neurological:     Mental Status: She is alert and oriented to person, place, and time.  Psychiatric:        Mood and Affect: Mood normal.        Behavior: Behavior normal.     Wt Readings from Last 3 Encounters:  05/03/23 177 lb (80.3 kg)  12/15/22 177 lb (80.3 kg)  07/15/22 181 lb (82.1 kg)    BP 124/70   Pulse 68   Temp 98.4 F (36.9 C) (Oral)   Ht 5' 2 (1.575 m)   Wt 177 lb (80.3 kg)   SpO2 94%   BMI 32.37 kg/m   Assessment and Plan:  Problem List Items Addressed This Visit       Unprioritized   Essential (primary) hypertension (Chronic)   Controlled BP  with normal exam. Current regimen is Coreg , Entresto , spironolactone, Jardiance  and lasix . Will continue same medications; encourage continued reduced sodium diet.       Relevant Medications   ezetimibe (ZETIA) 10 MG tablet   Acute on chronic congestive heart failure (HCC) (Chronic)   Overdue for Cardiology follow up - not seen in more than one year Medications being refilled by Cardiology      Relevant Medications   ezetimibe (ZETIA) 10 MG tablet   Hypertriglyceridemia (Chronic)   On Statin and Zetia added last year. No lipid or hepatic panel since 01/2022      Relevant Medications   ezetimibe (ZETIA) 10 MG tablet   Pre-diabetes (Chronic)   Managed with diet only. Lab Results  Component Value Date   HGBA1C 5.7 (H) 02/24/2022         Coronary artery disease of native artery of native heart with stable angina pectoris (HCC) (Chronic)   Relevant Medications   ezetimibe (ZETIA) 10 MG tablet   Chronic kidney disease, stage 3a (HCC) (Chronic)   Last GFR > 14 mo ago. Patient has not been compliant with follow up. Last GFR 42  01/2022      Other Visit Diagnoses       Viral URI with cough    -  Primary   Continue fluids, Tylenol and rest note to be out of work through Friday   Relevant Medications   benzonatate  (TESSALON ) 100 MG capsule   Other Relevant Orders   POC COVID-19 BinaxNow (Completed)   POCT Influenza A/B (Completed)       Return in about 4 months (around 08/31/2023) for CPX.    Leita HILARIO Adie, MD [facility] [place]

## 2023-05-03 NOTE — Assessment & Plan Note (Addendum)
On Statin and Zetia added last year. No lipid or hepatic panel since 01/2022

## 2023-05-03 NOTE — Assessment & Plan Note (Signed)
Managed with diet only. Lab Results  Component Value Date   HGBA1C 5.7 (H) 02/24/2022

## 2023-05-05 ENCOUNTER — Ambulatory Visit: Payer: Self-pay

## 2023-05-05 NOTE — Telephone Encounter (Signed)
 Chief Complaint: Cough Symptoms: Productive cough, green mucus  Frequency: constant Pertinent Negatives: Patient denies fever, chest pain, headache Disposition: [] ED /[] Urgent Care (no appt availability in office) / [] Appointment(In office/virtual)/ []  [place] [facility]/ [] Home Care/ [] Refused Recommended Disposition /[]  Mobile Bus/ [x]  Follow-up with PCP Additional Notes: Patient stated she was advised to call the office if her symptoms did not improve from her visit on  05/03/23. Patient states the cough has not improve and her symptoms have gotten worse. Patient stated at the time of the visit it was a dry cough, now the cough is productive with green sputum noted. Patient stated she is taking the Benzonatate  as prescribed without much improvement. Patient wants PCP to be aware and if she is prescribed new medication she would like the Rx to be sent to [facility] pharmacy in [place].  Summary: Symptoms not improved, PCP told her to call back   Pt called back reporting that her symptoms have not improved since she was seen on Tuesday, says she needs something else prescribed for her.  Best contact: [phone]     Reason for Disposition  [1] Recent medical visit within 24 hours AND [2] condition / symptoms WORSE  Answer Assessment - Initial Assessment Questions 1. MAIN CONCERN OR SYMPTOM:  What is your main concern right now? What question do you have? What's the main symptom you're worried about? (e.g., breathing difficulty, cough, fever. pain)     Cough 2. ONSET: When did the  Cough  start?     Monday  3. BETTER-SAME-WORSE: Are you getting better, staying the same, or getting worse compared to how you felt at your last visit to the doctor (most recent medical visit)?     Worse 4. VISIT DATE: When were you seen? (Date)     05/03/23 5. VISIT DOCTOR: What is the name of the doctor taking care of you now?     Dr. Justus  6. VISIT DIAGNOSIS:  What was the  main symptom or problem that you were seen for? Were you given a diagnosis?      URI 7. VISIT MEDICINES: Did the doctor order any new medicines for you to use? If Yes, ask: Have you filled the prescription and started taking the medicine?      Tessalon  Pearl  8. NEXT APPOINTMENT: Have you scheduled a follow-up appointment with your doctor?     N/A 9. PAIN: Is there any pain? If Yes, ask: How bad is it?  (Scale 0-10; or mild, moderate, severe)    - NONE (0): no pain    - MILD (1-3): doesn't interfere with normal activities     - MODERATE (4-7): interferes with normal activities or awakens from sleep     - SEVERE (8-10): excruciating pain, unable to do any normal activities     No pain  10. FEVER: Do you have a fever? If Yes, ask: What is it, how was it measured  and when did it start?       No  11. OTHER SYMPTOMS: Do you have any other symptoms?       Green sputum  Protocols used: Recent Medical Visit for Illness Follow-up Call-A-[facility]

## 2023-05-06 ENCOUNTER — Other Ambulatory Visit: Payer: Self-pay | Admitting: Internal Medicine

## 2023-05-06 DIAGNOSIS — J069 Acute upper respiratory infection, unspecified: Secondary | ICD-10-CM

## 2023-05-06 MED ORDER — AZITHROMYCIN 250 MG PO TABS: ORAL_TABLET | ORAL | 0 refills | Status: AC

## 2023-05-06 NOTE — Telephone Encounter (Signed)
 Left VM informing patient of abx sent to pharmacy.  - Brad Mcgaughy

## 2023-05-28 ENCOUNTER — Other Ambulatory Visit: Payer: Self-pay | Admitting: Internal Medicine

## 2023-05-30 NOTE — Telephone Encounter (Signed)
Requested medications are due for refill today.  Unsure medication is historical  Requested medications are on the active medications list.  yes  Last refill. 07/13/2021  Future visit scheduled.   yes  Notes to clinic.  Labs are expired. Medication is historical.    Requested Prescriptions  Pending Prescriptions Disp Refills   carvedilol (COREG) 25 MG tablet [Pharmacy Med Name: CARVEDILOL 25MG  TABLETS] 60 tablet     Sig: TAKE 1 TABLET(25 MG) BY MOUTH TWICE DAILY     Cardiovascular: Beta Blockers 3 Failed - 05/30/2023  5:20 PM      Failed - Cr in normal range and within 360 days    Creatinine, Ser  Date Value Ref Range Status  02/24/2022 1.38 (H) 0.57 - 1.00 mg/dL Final         Failed - AST in normal range and within 360 days    AST  Date Value Ref Range Status  02/24/2022 16 0 - 40 IU/L Final         Failed - ALT in normal range and within 360 days    ALT  Date Value Ref Range Status  02/24/2022 16 0 - 32 IU/L Final         Passed - Last BP in normal range    BP Readings from Last 1 Encounters:  05/03/23 124/70         Passed - Last Heart Rate in normal range    Pulse Readings from Last 1 Encounters:  05/03/23 68         Passed - Valid encounter within last 6 months    Recent Outpatient Visits           3 weeks ago Viral URI with cough   [place] [facility] at [facility], Nyoka Cowden, MD   5 months ago Acute URI   [facility] at [facility], Melton Alar, [place]   11 months ago COVID   [facility] at [facility], Ocie Bob, MD   1 year ago Viral upper respiratory tract infection   [place] [facility] at [facility], Nyoka Cowden, MD   1 year ago Annual physical exam   [facility] at [facility], Nyoka Cowden, MD       Future Appointments             In 3 months Judithann Graves,  Nyoka Cowden, MD [facility] at [facility], [facility]

## 2023-07-20 ENCOUNTER — Ambulatory Visit: Payer: Medicare Other

## 2023-07-20 DIAGNOSIS — Z0000 Encounter for general adult medical examination without abnormal findings: Secondary | ICD-10-CM

## 2023-07-20 DIAGNOSIS — Z1231 Encounter for screening mammogram for malignant neoplasm of breast: Secondary | ICD-10-CM

## 2023-07-20 DIAGNOSIS — Z1211 Encounter for screening for malignant neoplasm of colon: Secondary | ICD-10-CM

## 2023-07-20 NOTE — Progress Notes (Signed)
 Subjective:   Cassidy Dawson is a 68 y.o. who presents for a Medicare Wellness preventive visit.  Visit Complete: Virtual I connected with  [name] on 07/20/23 by a audio enabled telemedicine application and verified that I am speaking with the correct person using two identifiers.  Patient Location: Home  Provider Location: Office/Clinic  I discussed the limitations of evaluation and management by telemedicine. The patient expressed understanding and agreed to proceed.  Vital Signs: Because this visit was a virtual/telehealth visit, some criteria may be missing or patient reported. Any vitals not documented were not able to be obtained and vitals that have been documented are patient reported.  VideoDeclined- This patient declined Librarian, academic. Therefore the visit was completed with audio only.  Persons Participating in Visit: Patient.  AWV Questionnaire: No: Patient Medicare AWV questionnaire was not completed prior to this visit.  Cardiac Risk Factors include: advanced age (>75men, >57 women);dyslipidemia;hypertension;obesity (BMI >30kg/m2)     Objective:    There were no vitals filed for this visit. There is no height or weight on file to calculate BMI.     07/20/2023    2:46 PM 07/15/2022    9:34 AM 07/06/2021    9:27 AM 06/30/2020    9:48 AM  Advanced Directives  Does Patient Have a Medical Advance Directive? No No No No  Would patient like information on creating a medical advance directive? No - Patient declined No - Patient declined Yes (MAU/Ambulatory/Procedural Areas - Information given) Yes (MAU/Ambulatory/Procedural Areas - Information given)    Current Medications (verified) Outpatient Encounter Medications as of 07/20/2023  Medication Sig   Ascorbic Acid (VITAMIN C PO) Take by mouth daily.   aspirin 81 MG chewable tablet Chew 81 mg by mouth daily.   atorvastatin (LIPITOR) 80 MG tablet TAKE 1 TABLET(80  MG) BY MOUTH DAILY   carvedilol (COREG) 25 MG tablet TAKE 1 TABLET(25 MG) BY MOUTH TWICE DAILY   cetirizine (ZYRTEC) 10 MG tablet Take 1 tablet (10 mg total) by mouth daily.   Cyanocobalamin (VITAMIN B-12 PO) Take by mouth.   Ergocalciferol 50 MCG (2000 UT) CAPS Take 1 capsule by mouth daily.   fluticasone (FLONASE) 50 MCG/ACT nasal spray Place 2 sprays into both nostrils daily.   furosemide (LASIX) 40 MG tablet Take 40 mg by mouth daily.   JARDIANCE 10 MG TABS tablet Take 10 mg by mouth daily.   nitroGLYCERIN (NITROSTAT) 0.4 MG SL tablet PLACE 1 TABLET UNDER THE TONGUE EVERY 5 MINUTES AS NEEDED FOR CHEST PAIN. MAX OF 3 DOSES IN 15 MINUTES, IF PAIN CONTINUES CALL 911   sacubitril-valsartan (ENTRESTO) 49-51 MG Take 1 tablet by mouth 2 (two) times daily.   ezetimibe (ZETIA) 10 MG tablet Take 10 mg by mouth daily. (Patient not taking: Reported on 07/20/2023)   spironolactone (ALDACTONE) 25 MG tablet Take 25 mg by mouth daily. (Patient not taking: Reported on 07/20/2023)   No facility-administered encounter medications on file as of 07/20/2023.    Allergies (verified) Ace inhibitors and Codeine   History: Past Medical History:  Diagnosis Date   Environmental and seasonal allergies    Flash pulmonary edema (HCC) 01/02/2021   Heart attack (HCC) 06/2016   Second: 01/2017    History of biliary T-tube placement 08/14/2014   Hyperlipidemia    Hypertension    Past Surgical History:  Procedure Laterality Date   CORONARY ANGIOPLASTY WITH STENT PLACEMENT  01/2017   DES to LAD   UTERINE FIBROID  SURGERY  1996   Family History  Problem Relation Age of Onset   Hypertension Father    Kidney disease Father    Breast cancer Mother    Hypertension Brother    Renal Disease Brother    Social History   Socioeconomic History   Marital status: Married    Spouse name: Not on file   Number of children: 0   Years of education: Not on file   Highest education level: Not on file  Occupational History    Not on file  Tobacco Use   Smoking status: Never   Smokeless tobacco: Never  Vaping Use   Vaping status: Never Used  Substance and Sexual Activity   Alcohol use: Never    Alcohol/week: 0.0 standard drinks of alcohol   Drug use: Never   Sexual activity: Not Currently  Other Topics Concern   Not on file  Social History Narrative   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[employer]   Primary caregiver for mother who lives in [place]   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[employer]   Social Drivers of Health   Financial Resource Strain: Low Risk  (07/20/2023)   Overall Financial Resource Strain (CARDIA)    Difficulty of Paying Living Expenses: Not very hard  Food Insecurity: No Food Insecurity (07/20/2023)   Hunger Vital Sign    Worried About Running Out of Food in the Last Year: Never true    Ran Out of Food in the Last Year: Never true  Transportation Needs: No Transportation Needs (07/20/2023)   PRAPARE - Administrator, Civil Service (Medical): No    Lack of Transportation (Non-Medical): No  Physical Activity: Sufficiently Active (07/20/2023)   Exercise Vital Sign    Days of Exercise per Week: 3 days    Minutes of Exercise per Session: 60 min  Stress: No Stress Concern Present (07/20/2023)   [employer] of Occupational Health - Occupational Stress Questionnaire    Feeling of Stress : Not at all  Social Connections: Moderately Integrated (07/20/2023)   Social Connection and Isolation Panel [NHANES]    Frequency of Communication with Friends and Family: Three times a week    Frequency of Social Gatherings with Friends and Family: More than three times a week    Attends Religious Services: More than 4 times per year    Active Member of [employer] or Organizations: No    Attends Engineer, structural: Never    Marital Status: Married    Tobacco Counseling Counseling given: Not Answered    Clinical Intake:  Pre-visit preparation completed: Yes  Pain : No/denies pain     BMI -  recorded: 32.4 Nutritional Status: BMI > 30  Obese Nutritional Risks: None Diabetes: No  Lab Results  Component Value Date   HGBA1C 5.7 (H) 02/24/2022   HGBA1C 6.5 (A) 11/06/2021   HGBA1C 6.0 (H) 10/08/2020     How often do you need to have someone help you when you read instructions, pamphlets, or other written materials from your doctor or pharmacy?: 1 - Never  Interpreter Needed?: No  Information entered by :: Kennedy Bucker, LPN   Activities of Daily Living     07/20/2023    2:48 PM  In your present state of health, do you have any difficulty performing the following activities:  Hearing? 0  Vision? 0  Difficulty concentrating or making decisions? 0  Walking or climbing stairs? 0  Dressing or bathing? 0  Doing errands, shopping? 0  Preparing  Food and eating ? N  Using the Toilet? N  In the past six months, have you accidently leaked urine? N  Do you have problems with loss of bowel control? N  Managing your Medications? N  Managing your Finances? N  Housekeeping or managing your Housekeeping? N    Patient Care Team: Reubin Milan, MD as PCP - General (Internal Medicine) Yisroel Ramming, MD as Referring Physician (Cardiology) Christain Sacramento, MD as Referring Physician (Cardiology) Verne Carrow, OD (Optometry)  Indicate any recent Medical Services you may have received from other than [facility] providers in the past year (date may be approximate).     Assessment:   This is a routine wellness examination for Cassidy Dawson.  Hearing/Vision screen Hearing Screening - Comments:: NO AIDS Vision Screening - Comments:: WEARS GLASSES ALL THE TIME- DR.[email or web address]   Goals Addressed             This Visit's Progress    DIET - INCREASE WATER INTAKE         Depression Screen     07/20/2023    2:44 PM 05/03/2023    2:08 PM 12/15/2022   10:56 AM 07/15/2022    9:33 AM 04/09/2022    9:25 AM 02/24/2022   11:06 AM 11/06/2021    3:17 PM  PHQ 2/9 Scores  PHQ - 2 Score 0 0 0 0  0 0 0  PHQ- 9 Score 0 0 0 0 0 0 0    Fall Risk     07/20/2023    2:47 PM 05/03/2023    2:08 PM 12/15/2022   10:55 AM 07/15/2022    9:35 AM 04/09/2022    9:25 AM  Fall Risk   Falls in the past year? 0 0 0 0 0  Number falls in past yr: 0 0 0 0 0  Injury with Fall? 0 0 0 0 0  Risk for fall due to : No Fall Risks No Fall Risks No Fall Risks No Fall Risks No Fall Risks  Follow up Falls prevention discussed;Falls evaluation completed Falls evaluation completed Falls evaluation completed Falls prevention discussed;Falls evaluation completed Falls evaluation completed    MEDICARE RISK AT HOME:  Medicare Risk at Home Any stairs in or around the home?: Yes If so, are there any without handrails?: Yes Home free of loose throw rugs in walkways, pet beds, electrical cords, etc?: Yes Adequate lighting in your home to reduce risk of falls?: Yes Life alert?: No Use of a cane, walker or w/c?: No Grab bars in the bathroom?: Yes Shower chair or bench in shower?: No Elevated toilet seat or a handicapped toilet?: Yes  TIMED UP AND GO:  Was the test performed?  No  Cognitive Function: 6CIT completed        07/20/2023    2:50 PM 07/15/2022    9:40 AM  6CIT Screen  What Year? 0 points 0 points  What month? 0 points 0 points  What time? 0 points 0 points  Count back from 20 0 points 0 points  Months in reverse 0 points 0 points  Repeat phrase 0 points 0 points  Total Score 0 points 0 points    Immunizations Immunization History  Administered Date(s) Administered   Fluad Quad(high Dose 65+) 01/14/2021, 02/24/2022   Influenza,inj,Quad PF,6+ Mos 06/28/2016, 05/02/2018, 01/22/2019   Influenza-Unspecified 03/13/2023   Janssen (J&J) SARS-COV-2 Vaccination 07/14/2019   PFIZER(Purple Top)SARS-COV-2 Vaccination 02/01/2020, 06/12/2021   Pfizer(Comirnaty)Fall Seasonal Vaccine 12 years and older 02/24/2022  Pneumococcal Conjugate-13 10/04/2019   Pneumococcal Polysaccharide-23 10/08/2020   Tdap  06/12/2021    Screening Tests Health Maintenance  Topic Date Due   Zoster Vaccines- Shingrix (1 of 2) Never done   Colonoscopy  Never done   MAMMOGRAM  08/12/2021   COVID-19 Vaccine (5 - 2024-25 season) 01/02/2023   Medicare Annual Wellness (AWV)  07/19/2024   DEXA SCAN  08/12/2025   DTaP/Tdap/Td (2 - Td or Tdap) 06/13/2031   Pneumonia Vaccine 1+ Years old  Completed   INFLUENZA VACCINE  Completed   Hepatitis C Screening  Completed   HPV VACCINES  Aged Out    Health Maintenance  Health Maintenance Due  Topic Date Due   Zoster Vaccines- Shingrix (1 of 2) Never done   Colonoscopy  Never done   MAMMOGRAM  08/12/2021   COVID-19 Vaccine (5 - 2024-25 season) 01/02/2023   Health Maintenance Items Addressed: Mammogram ordered  Additional Screening:  Vision Screening: Recommended annual ophthalmology exams for early detection of glaucoma and other disorders of the eye.  Dental Screening: Recommended annual dental exams for proper oral hygiene  Community Resource Referral / Chronic Care Management: CRR required this visit?  No   CCM required this visit?  No     Plan:     I have personally reviewed and noted the following in the patient's chart:   Medical and social history Use of alcohol, tobacco or illicit drugs  Current medications and supplements including opioid prescriptions. Patient is not currently taking opioid prescriptions. Functional ability and status Nutritional status Physical activity Advanced directives List of other physicians Hospitalizations, surgeries, and ER visits in previous 12 months Vitals Screenings to include cognitive, depression, and falls Referrals and appointments  In addition, I have reviewed and discussed with patient certain preventive protocols, quality metrics, and best practice recommendations. A written personalized care plan for preventive services as well as general preventive health recommendations were provided to  patient.     Hal Hope, LPN   [id number]   After Visit Summary: (MyChart) Due to this being a telephonic visit, the after visit summary with patients personalized plan was offered to patient via MyChart   Notes:  REFERRALS SENT FOR MAMMOGRAM & COLONOSCOPY

## 2023-07-20 NOTE — Patient Instructions (Addendum)
 [name] , Thank you for taking time to come for your Medicare Wellness Visit. I appreciate your ongoing commitment to your health goals. Please review the following plan we discussed and let me know if I can assist you in the future.   Referrals/Orders/Follow-Ups/Clinician Recommendations: REFERRAL FOR MAMMOGRAM & COLONOSCOPY  You have an order for:  []   2D Mammogram  [x]   3D Mammogram  []   Bone Density     Please call for appointment:  [facility]  [address] [place] [address] [phone] [facility] [address] [place], [place] [address] [phone] [facility] [address] Geanie Logan [place], [place] [address] [phone]   Make sure to wear two-piece clothing.  No lotions, powders, or deodorants the day of the appointment. Make sure to bring picture ID and insurance card.  Bring list of medications you are currently taking including any supplements.   Schedule your [place] screening mammogram through MyChart!   Log into your MyChart account.  Go to 'Visit' (or 'Appointments' if on mobile App) --> Schedule an Appointment  Under 'Select a Reason for Visit' choose the Mammogram Screening option.  Complete the pre-visit questions and select the time and place that best fits your schedule.   This is a list of the screening recommended for you and due dates:  Health Maintenance  Topic Date Due   Zoster (Shingles) Vaccine (1 of 2) Never done   Colon Cancer Screening  Never done   Mammogram  08/12/2021   COVID-19 Vaccine (5 - 2024-25 season) 01/02/2023   Medicare Annual Wellness Visit  07/19/2024   DEXA scan (bone density measurement)  08/12/2025   DTaP/Tdap/Td vaccine (2 - Td or Tdap) 06/13/2031   Pneumonia Vaccine  Completed   Flu Shot  Completed   Hepatitis C Screening  Completed   HPV Vaccine  Aged Out    Advanced directives:  (ACP Link)Information on Advanced Care Planning can be found at [facility] Advance Health Care Directives Advance Health Care Directives. [email or web address]   Next Medicare Annual Wellness Visit scheduled for next year: Yes   07/25/24 @ 1:20 PM BY PHONE

## 2023-08-03 ENCOUNTER — Encounter: Payer: Self-pay | Admitting: *Deleted

## 2023-08-26 ENCOUNTER — Encounter: Payer: Self-pay | Admitting: Internal Medicine

## 2023-08-26 ENCOUNTER — Ambulatory Visit (INDEPENDENT_AMBULATORY_CARE_PROVIDER_SITE_OTHER): Admitting: Internal Medicine

## 2023-08-26 VITALS — BP 120/82 | HR 64 | Temp 98.1°F | Ht 62.0 in | Wt 176.0 lb

## 2023-08-26 DIAGNOSIS — J40 Bronchitis, not specified as acute or chronic: Secondary | ICD-10-CM

## 2023-08-26 DIAGNOSIS — H6993 Unspecified Eustachian tube disorder, bilateral: Secondary | ICD-10-CM

## 2023-08-26 MED ORDER — DOXYCYCLINE HYCLATE 100 MG PO TABS: 100.0000 mg | ORAL_TABLET | Freq: Two times a day (BID) | ORAL | 0 refills | Status: AC

## 2023-08-26 MED ORDER — PREDNISONE 10 MG PO TABS: ORAL_TABLET | ORAL | 0 refills | Status: AC

## 2023-08-26 MED ORDER — BENZONATATE 100 MG PO CAPS: 100.0000 mg | ORAL_CAPSULE | Freq: Three times a day (TID) | ORAL | 0 refills | Status: AC

## 2023-08-26 NOTE — Progress Notes (Signed)
 Date:  08/26/2023   Name:  Cassidy Dawson   DOB:  1956-03-18   MRN:  [id number]   Chief Complaint: Cough  Cough This is a chronic problem. Episode onset: X3 weeks. The problem has been gradually worsening. The problem occurs hourly. The cough is Non-productive. Associated symptoms include chills, ear pain, a fever, headaches, nasal congestion, postnasal drip, rhinorrhea, shortness of breath and wheezing. Pertinent negatives include no chest pain. Associated symptoms comments: Fatigue . She has tried OTC cough suppressant for the symptoms. The treatment provided no relief. Her past medical history is significant for environmental allergies.    Review of Systems  Constitutional:  Positive for chills and fever.  HENT:  Positive for ear pain, postnasal drip and rhinorrhea. Negative for trouble swallowing.   Eyes:  Negative for visual disturbance.  Respiratory:  Positive for cough, shortness of breath and wheezing.   Cardiovascular:  Negative for chest pain and palpitations.  Gastrointestinal:  Negative for abdominal pain, nausea and vomiting.  Allergic/Immunologic: Positive for environmental allergies.  Neurological:  Positive for headaches. Negative for dizziness and light-headedness.  Psychiatric/Behavioral:  Negative for dysphoric mood and sleep disturbance.      Lab Results  Component Value Date   NA 142 02/24/2022   K 5.0 02/24/2022   CO2 26 02/24/2022   GLUCOSE 95 02/24/2022   BUN 25 02/24/2022   CREATININE 1.38 (H) 02/24/2022   CALCIUM  9.9 02/24/2022   EGFR 42 (L) 02/24/2022   GFRNONAA 78 10/04/2019   Lab Results  Component Value Date   CHOL 164 02/24/2022   HDL 36 (L) 02/24/2022   LDLCALC 101 (H) 02/24/2022   LDLDIRECT 84 02/24/2022   TRIG 155 (H) 02/24/2022   CHOLHDL 4.6 (H) 02/24/2022   Lab Results  Component Value Date   TSH 1.730 02/24/2022   Lab Results  Component Value Date   HGBA1C 5.7 (H) 02/24/2022   Lab Results  Component Value Date    WBC 7.1 02/24/2022   HGB 13.4 02/24/2022   HCT 40.6 02/24/2022   MCV 92 02/24/2022   PLT 403 02/24/2022   Lab Results  Component Value Date   ALT 16 02/24/2022   AST 16 02/24/2022   ALKPHOS 124 (H) 02/24/2022   BILITOT 0.5 02/24/2022   No results found for: "25OHVITD2", "25OHVITD3", "VD25OH"   Patient Active Problem List   Diagnosis Date Noted   Chronic kidney disease, stage 3a (HCC) 11/06/2021   Slow transit constipation 02/11/2021   Coronary artery disease of native artery of native heart with stable angina pectoris (HCC) 10/08/2020   Plantar wart 05/02/2018   Hypertriglyceridemia 05/01/2018   Pre-diabetes 05/01/2018   Acute on chronic congestive heart failure (HCC) 01/11/2017   History of non-ST elevation myocardial infarction (NSTEMI) 01/11/2017   Essential (primary) hypertension 08/14/2014   Allergic rhinitis, seasonal 08/14/2014    Allergies  Allergen Reactions   Ace Inhibitors Cough   Codeine Nausea And Vomiting    Past Surgical History:  Procedure Laterality Date   CORONARY ANGIOPLASTY WITH STENT PLACEMENT  01/2017   DES to LAD   UTERINE FIBROID SURGERY  1996    Social History   Tobacco Use   Smoking status: Never   Smokeless tobacco: Never  Vaping Use   Vaping status: Never Used  Substance Use Topics   Alcohol use: Never    Alcohol/week: 0.0 standard drinks of alcohol   Drug use: Never     Medication list has been reviewed and updated.  Current  Meds  Medication Sig   Ascorbic Acid (VITAMIN C PO) Take by mouth daily.   aspirin 81 MG chewable tablet Chew 81 mg by mouth daily.   atorvastatin  (LIPITOR) 80 MG tablet TAKE 1 TABLET(80 MG) BY MOUTH DAILY   benzonatate  (TESSALON ) 100 MG capsule Take 1 capsule (100 mg total) by mouth 3 (three) times daily for 10 days.   carvedilol  (COREG ) 25 MG tablet TAKE 1 TABLET(25 MG) BY MOUTH TWICE DAILY   cetirizine  (ZYRTEC ) 10 MG tablet Take 1 tablet (10 mg total) by mouth daily.   Cyanocobalamin (VITAMIN B-12  PO) Take by mouth.   doxycycline  (VIBRA -TABS) 100 MG tablet Take 1 tablet (100 mg total) by mouth 2 (two) times daily for 10 days.   Ergocalciferol 50 MCG (2000 UT) CAPS Take 1 capsule by mouth daily.   ezetimibe (ZETIA) 10 MG tablet Take 10 mg by mouth daily.   fluticasone  (FLONASE ) 50 MCG/ACT nasal spray Place 2 sprays into both nostrils daily.   furosemide  (LASIX ) 40 MG tablet Take 40 mg by mouth daily.   JARDIANCE 10 MG TABS tablet Take 10 mg by mouth daily.   nitroGLYCERIN  (NITROSTAT ) 0.4 MG SL tablet PLACE 1 TABLET UNDER THE TONGUE EVERY 5 MINUTES AS NEEDED FOR CHEST PAIN. MAX OF 3 DOSES IN 15 MINUTES, IF PAIN CONTINUES CALL 911   predniSONE (DELTASONE) 10 MG tablet Take 6 tablets (60 mg total) by mouth daily with breakfast for 1 day, THEN 5 tablets (50 mg total) daily with breakfast for 1 day, THEN 4 tablets (40 mg total) daily with breakfast for 1 day, THEN 3 tablets (30 mg total) daily with breakfast for 1 day, THEN 2 tablets (20 mg total) daily with breakfast for 1 day, THEN 1 tablet (10 mg total) daily with breakfast for 1 day.   sacubitril-valsartan (ENTRESTO) 49-51 MG Take 1 tablet by mouth 2 (two) times daily.       08/26/2023    2:36 PM 05/03/2023    2:08 PM 12/15/2022   10:56 AM 04/09/2022    9:25 AM  GAD 7 : Generalized Anxiety Score  Nervous, Anxious, on Edge 0 0 0 0  Control/stop worrying 0 0 0 0  Worry too much - different things 0 0 0 0  Trouble relaxing 0 0 0 0  Restless 0 0 0 0  Easily annoyed or irritable 0 0 0 0  Afraid - awful might happen 0 0 0 0  Total GAD 7 Score 0 0 0 0  Anxiety Difficulty Not difficult at all Not difficult at all Not difficult at all Not difficult at all       08/26/2023    2:35 PM 07/20/2023    2:44 PM 05/03/2023    2:08 PM  Depression screen PHQ 2/9  Decreased Interest 0 0 0  Down, Depressed, Hopeless 0 0 0  PHQ - 2 Score 0 0 0  Altered sleeping  0 0  Tired, decreased energy  0 0  Change in appetite  0 0  Feeling bad or failure  about yourself   0 0  Trouble concentrating  0 0  Moving slowly or fidgety/restless  0 0  Suicidal thoughts  0 0  PHQ-9 Score  0 0  Difficult doing work/chores  Not difficult at all Not difficult at all    BP Readings from Last 3 Encounters:  08/26/23 120/82  05/03/23 124/70  12/15/22 (!) 142/90    Physical Exam Constitutional:      Appearance:  Normal appearance.  HENT:     Right Ear: Tympanic membrane is erythematous and retracted.     Left Ear: Tympanic membrane is retracted. Tympanic membrane is not erythematous.     Nose:     Right Sinus: No maxillary sinus tenderness or frontal sinus tenderness.     Left Sinus: No maxillary sinus tenderness or frontal sinus tenderness.     Mouth/Throat:     Pharynx: Posterior oropharyngeal erythema present. No oropharyngeal exudate.  Cardiovascular:     Rate and Rhythm: Normal rate and regular rhythm.     Heart sounds: No murmur heard. Pulmonary:     Effort: No respiratory distress.     Breath sounds: No wheezing or rhonchi.  Musculoskeletal:     Cervical back: Normal range of motion.  Lymphadenopathy:     Cervical: No cervical adenopathy.  Neurological:     Mental Status: She is alert.     Wt Readings from Last 3 Encounters:  08/26/23 176 lb (79.8 kg)  05/03/23 177 lb (80.3 kg)  12/15/22 177 lb (80.3 kg)    BP 120/82   Pulse 64   Temp 98.1 F (36.7 C)   Ht 5\' 2"  (1.575 m)   Wt 176 lb (79.8 kg)   SpO2 96%   BMI 32.19 kg/m   Assessment and Plan:  Problem List Items Addressed This Visit   None Visit Diagnoses       Bronchitis    -  Primary   continue Tylenol and fluids, rest avoid albuterol due to cardiac issues will give steroid taper for c/o wheezing and ear congestion   Relevant Medications   doxycycline  (VIBRA -TABS) 100 MG tablet   benzonatate  (TESSALON ) 100 MG capsule     Disorder of both eustachian tubes       Relevant Medications   predniSONE (DELTASONE) 10 MG tablet       No follow-ups on file.     Sheron Dixons, MD [facility] [place]

## 2023-08-26 NOTE — Patient Instructions (Addendum)
 Call [facility] to schedule your mammogram at [phone].   We have received a referral from your PCP to schedule your Colonoscopy.  Please call our office at your convenience to scheduled your procedure.  Our office hours are Mon-Thur 8am to 5pm, Fri 8am to 12pm.   Thank you,  [facility] [phone]

## 2023-09-12 ENCOUNTER — Encounter: Payer: Self-pay | Admitting: Internal Medicine

## 2023-09-12 NOTE — Assessment & Plan Note (Deleted)
 She has not been evaluated in 18 months Last GFR 42

## 2023-09-12 NOTE — Assessment & Plan Note (Deleted)
 LDL is  Lab Results  Component Value Date   LDLCALC 101 (H) 02/24/2022   Current regimen is Zetia and Lipitor. No medication side effects noted. Goal LDL is <55.

## 2023-09-12 NOTE — Assessment & Plan Note (Deleted)
 Managed with diet only. Lab Results  Component Value Date   HGBA1C 5.7 (H) 02/24/2022

## 2023-09-12 NOTE — Assessment & Plan Note (Deleted)
 Blood pressure is well controlled.  Current medications Coreg , Lasix , and Entresto Will continue same regimen along with efforts to limit dietary sodium.

## 2023-09-12 NOTE — Progress Notes (Deleted)
 Date:  09/12/2023   Name:  Cassidy Dawson   DOB:  04-10-1956   MRN:  [id number]   Chief Complaint: No chief complaint on file. Cassidy Dawson is a 68 y.o. female who presents today for her Complete Annual Exam. She feels {DESC; WELL/FAIRLY WELL/POORLY:18703}. She reports exercising ***. She reports she is sleeping {DESC; WELL/FAIRLY WELL/POORLY:18703}. Breast complaints ***.  Health Maintenance  Topic Date Due   Zoster (Shingles) Vaccine (1 of 2) Never done   Colon Cancer Screening  Never done   Mammogram  08/12/2021   COVID-19 Vaccine (5 - 2024-25 season) 01/02/2023   Flu Shot  12/02/2023   Medicare Annual Wellness Visit  07/19/2024   DEXA scan (bone density measurement)  08/12/2025   DTaP/Tdap/Td vaccine (2 - Td or Tdap) 06/13/2031   Pneumonia Vaccine  Completed   Hepatitis C Screening  Completed   HPV Vaccine  Aged Out   Meningitis B Vaccine  Aged Out    Congestive Heart Failure Presents for follow-up visit. Pertinent negatives include no abdominal pain, chest pain, fatigue, palpitations, shortness of breath or unexpected weight change. The symptoms have been stable. Compliance with total regimen: on jardiance, Entresto, Coreg  and lasix . Compliance with medications is 76-100%.  Hypertension This is a chronic problem. The problem is controlled. Pertinent negatives include no chest pain, headaches, palpitations or shortness of breath. Past treatments include beta blockers and diuretics (and Enstreso). Hypertensive end-organ damage includes CAD/MI. There is no history of kidney disease or CVA.  Hyperlipidemia This is a chronic problem. Recent lipid tests were reviewed and are high. Pertinent negatives include no chest pain, myalgias or shortness of breath. Current antihyperlipidemic treatment includes ezetimibe. The current treatment provides moderate improvement of lipids.  Diabetes She presents for her follow-up diabetic visit. Diabetes type: prediabetes. Her  disease course has been stable. Pertinent negatives for hypoglycemia include no dizziness or headaches. Pertinent negatives for diabetes include no chest pain, no fatigue and no weakness. Pertinent negatives for diabetic complications include no CVA.    Review of Systems  Constitutional:  Negative for fatigue and unexpected weight change.  HENT:  Negative for trouble swallowing.   Eyes:  Negative for visual disturbance.  Respiratory:  Negative for cough, chest tightness, shortness of breath and wheezing.   Cardiovascular:  Negative for chest pain, palpitations and leg swelling.  Gastrointestinal:  Negative for abdominal pain, constipation and diarrhea.  Musculoskeletal:  Negative for arthralgias and myalgias.  Neurological:  Negative for dizziness, weakness, light-headedness and headaches.     Lab Results  Component Value Date   NA 142 02/24/2022   K 5.0 02/24/2022   CO2 26 02/24/2022   GLUCOSE 95 02/24/2022   BUN 25 02/24/2022   CREATININE 1.38 (H) 02/24/2022   CALCIUM  9.9 02/24/2022   EGFR 42 (L) 02/24/2022   GFRNONAA 78 10/04/2019   Lab Results  Component Value Date   CHOL 164 02/24/2022   HDL 36 (L) 02/24/2022   LDLCALC 101 (H) 02/24/2022   LDLDIRECT 84 02/24/2022   TRIG 155 (H) 02/24/2022   CHOLHDL 4.6 (H) 02/24/2022   Lab Results  Component Value Date   TSH 1.730 02/24/2022   Lab Results  Component Value Date   HGBA1C 5.7 (H) 02/24/2022   Lab Results  Component Value Date   WBC 7.1 02/24/2022   HGB 13.4 02/24/2022   HCT 40.6 02/24/2022   MCV 92 02/24/2022   PLT 403 02/24/2022   Lab Results  Component Value Date  ALT 16 02/24/2022   AST 16 02/24/2022   ALKPHOS 124 (H) 02/24/2022   BILITOT 0.5 02/24/2022   No results found for: "25OHVITD2", "25OHVITD3", "VD25OH"   Patient Active Problem List   Diagnosis Date Noted   Chronic kidney disease, stage 3a (HCC) 11/06/2021   Slow transit constipation 02/11/2021   Coronary artery disease of native artery of  native heart with stable angina pectoris (HCC) 10/08/2020   Plantar wart 05/02/2018   Mixed hyperlipidemia 05/01/2018   Pre-diabetes 05/01/2018   Chronic systolic heart failure (HCC) 01/11/2017   History of non-ST elevation myocardial infarction (NSTEMI) 01/11/2017   Essential (primary) hypertension 08/14/2014   Allergic rhinitis, seasonal 08/14/2014    Allergies  Allergen Reactions   Ace Inhibitors Cough   Codeine Nausea And Vomiting    Past Surgical History:  Procedure Laterality Date   CORONARY ANGIOPLASTY WITH STENT PLACEMENT  01/2017   DES to LAD   UTERINE FIBROID SURGERY  1996    Social History   Tobacco Use   Smoking status: Never   Smokeless tobacco: Never  Vaping Use   Vaping status: Never Used  Substance Use Topics   Alcohol use: Never    Alcohol/week: 0.0 standard drinks of alcohol   Drug use: Never     Medication list has been reviewed and updated.  No outpatient medications have been marked as taking for the 09/12/23 encounter (Appointment) with Sheron Dixons, MD.       08/26/2023    2:36 PM 05/03/2023    2:08 PM 12/15/2022   10:56 AM 04/09/2022    9:25 AM  GAD 7 : Generalized Anxiety Score  Nervous, Anxious, on Edge 0 0 0 0  Control/stop worrying 0 0 0 0  Worry too much - different things 0 0 0 0  Trouble relaxing 0 0 0 0  Restless 0 0 0 0  Easily annoyed or irritable 0 0 0 0  Afraid - awful might happen 0 0 0 0  Total GAD 7 Score 0 0 0 0  Anxiety Difficulty Not difficult at all Not difficult at all Not difficult at all Not difficult at all       08/26/2023    2:35 PM 07/20/2023    2:44 PM 05/03/2023    2:08 PM  Depression screen PHQ 2/9  Decreased Interest 0 0 0  Down, Depressed, Hopeless 0 0 0  PHQ - 2 Score 0 0 0  Altered sleeping  0 0  Tired, decreased energy  0 0  Change in appetite  0 0  Feeling bad or failure about yourself   0 0  Trouble concentrating  0 0  Moving slowly or fidgety/restless  0 0  Suicidal thoughts  0 0   PHQ-9 Score  0 0  Difficult doing work/chores  Not difficult at all Not difficult at all    BP Readings from Last 3 Encounters:  08/26/23 120/82  05/03/23 124/70  12/15/22 (!) 142/90    Physical Exam Vitals and nursing note reviewed.  Constitutional:      General: She is not in acute distress.    Appearance: She is well-developed.  HENT:     Head: Normocephalic and atraumatic.     Right Ear: Tympanic membrane and ear canal normal.     Left Ear: Tympanic membrane and ear canal normal.     Nose:     Right Sinus: No maxillary sinus tenderness.     Left Sinus: No maxillary sinus tenderness.  Eyes:     General: No scleral icterus.       Right eye: No discharge.        Left eye: No discharge.     Conjunctiva/sclera: Conjunctivae normal.  Neck:     Thyroid : No thyromegaly.     Vascular: No carotid bruit.  Cardiovascular:     Rate and Rhythm: Normal rate and regular rhythm.     Pulses: Normal pulses.     Heart sounds: Normal heart sounds.  Pulmonary:     Effort: Pulmonary effort is normal. No respiratory distress.     Breath sounds: No wheezing.  Abdominal:     General: Bowel sounds are normal.     Palpations: Abdomen is soft.     Tenderness: There is no abdominal tenderness.  Musculoskeletal:     Cervical back: Normal range of motion. No erythema.     Right lower leg: No edema.     Left lower leg: No edema.  Lymphadenopathy:     Cervical: No cervical adenopathy.  Skin:    General: Skin is warm and dry.     Findings: No rash.  Neurological:     Mental Status: She is alert and oriented to person, place, and time.     Cranial Nerves: No cranial nerve deficit.     Sensory: No sensory deficit.     Deep Tendon Reflexes: Reflexes are normal and symmetric.  Psychiatric:        Attention and Perception: Attention normal.        Mood and Affect: Mood normal.     Wt Readings from Last 3 Encounters:  08/26/23 176 lb (79.8 kg)  05/03/23 177 lb (80.3 kg)  12/15/22 177 lb  (80.3 kg)    There were no vitals taken for this visit.  Assessment and Plan:  Problem List Items Addressed This Visit       Unprioritized   Essential (primary) hypertension (Chronic)   Blood pressure is well controlled.  Current medications Coreg , Lasix , and Entresto Will continue same regimen along with efforts to limit dietary sodium.       Chronic systolic heart failure (HCC)   Mixed hyperlipidemia   LDL is  Lab Results  Component Value Date   LDLCALC 101 (H) 02/24/2022   Current regimen is Zetia and Lipitor. No medication side effects noted. Goal LDL is <55.       Pre-diabetes (Chronic)   Managed with diet only. Lab Results  Component Value Date   HGBA1C 5.7 (H) 02/24/2022         Chronic kidney disease, stage 3a (HCC) (Chronic)   She has not been evaluated in 18 months Last GFR 42       Other Visit Diagnoses       Annual physical exam    -  Primary     Encounter for screening mammogram for breast cancer       Needs to schedule [facility]     Colon cancer screening       referred to [facility] but unable to connect       No follow-ups on file.    Sheron Dixons, MD [facility] and [facility] [place]

## 2023-10-06 ENCOUNTER — Ambulatory Visit
Admission: RE | Admit: 2023-10-06 | Discharge: 2023-10-06 | Disposition: A | Discharge status: Home / Self Care | Source: Ambulatory Visit | Attending: Family Medicine | Admitting: Family Medicine

## 2023-10-06 ENCOUNTER — Ambulatory Visit
Admission: RE | Admit: 2023-10-06 | Discharge: 2023-10-06 | Disposition: A | Discharge status: Home / Self Care | Attending: Family Medicine | Admitting: Family Medicine

## 2023-10-06 ENCOUNTER — Ambulatory Visit: Payer: Self-pay | Admitting: *Deleted

## 2023-10-06 ENCOUNTER — Encounter: Payer: Self-pay | Admitting: Family Medicine

## 2023-10-06 ENCOUNTER — Ambulatory Visit (INDEPENDENT_AMBULATORY_CARE_PROVIDER_SITE_OTHER): Admitting: Family Medicine

## 2023-10-06 VITALS — BP 124/80 | HR 76 | Temp 99.0°F | Ht 62.0 in | Wt 175.0 lb

## 2023-10-06 DIAGNOSIS — R059 Cough, unspecified: Secondary | ICD-10-CM

## 2023-10-06 DIAGNOSIS — R0602 Shortness of breath: Secondary | ICD-10-CM

## 2023-10-06 DIAGNOSIS — I5043 Acute on chronic combined systolic (congestive) and diastolic (congestive) heart failure: Secondary | ICD-10-CM | POA: Diagnosis not present

## 2023-10-06 DIAGNOSIS — R509 Fever, unspecified: Secondary | ICD-10-CM

## 2023-10-06 DIAGNOSIS — N1831 Chronic kidney disease, stage 3a: Secondary | ICD-10-CM | POA: Diagnosis not present

## 2023-10-06 DIAGNOSIS — R0989 Other specified symptoms and signs involving the circulatory and respiratory systems: Secondary | ICD-10-CM | POA: Diagnosis not present

## 2023-10-06 LAB — POC COVID19 BINAXNOW: SARS Coronavirus 2 Ag: NEGATIVE

## 2023-10-06 MED ORDER — BENZONATATE 200 MG PO CAPS: 200.0000 mg | ORAL_CAPSULE | Freq: Three times a day (TID) | ORAL | 0 refills | Status: AC | PRN

## 2023-10-06 MED ORDER — DOXYCYCLINE HYCLATE 100 MG PO TABS: 100.0000 mg | ORAL_TABLET | Freq: Two times a day (BID) | ORAL | 0 refills | Status: AC

## 2023-10-06 NOTE — Telephone Encounter (Signed)
                                     FYI Only or Action Required?: Action required by provider  Patient was last seen in primary care on 08/26/2023 by Sheron Dixons, MD. Called Nurse Triage reporting Cough. Symptoms began several days ago. Interventions attempted: OTC medications: mucinex, cough medication from prescriber. Symptoms are: gradually worsening.  Triage Disposition: See HCP Within 4 Hours (Or PCP Triage)  Patient/caregiver understands and will follow disposition?yes        : Copied from CRM [id number]. Topic: Clinical - Red Word Triage >> Oct 06, 2023 10:42 AM Stanly Early wrote: Sob,fever on and off, coughing Reason for Disposition  [1] MILD difficulty breathing (e.g., minimal/no SOB at rest, SOB with walking, pulse <100) AND [2] still present when not coughing  Answer Assessment - Initial Assessment Questions 1. ONSET: "When did the cough begin?"      After 08/26/23 appt sx better approx 1 week and cough started back  2. SEVERITY: "How bad is the cough today?"      Continued cough unable to cough up any sputum but feels like  3. SPUTUM: "Describe the color of your sputum" (none, dry cough; clear, white, yellow, green)     None  4. HEMOPTYSIS: "Are you coughing up any blood?" If so ask: "How much?" (flecks, streaks, tablespoons, etc.)     na 5. DIFFICULTY BREATHING: "Are you having difficulty breathing?" If Yes, ask: "How bad is it?" (e.g., mild, moderate, severe)    - MILD: No SOB at rest, mild SOB with walking, speaks normally in sentences, can lie down, no retractions, pulse < 100.    - MODERATE: SOB at rest, SOB with minimal exertion and prefers to sit, cannot lie down flat, speaks in phrases, mild retractions, audible wheezing, pulse 100-120.    - SEVERE: Very SOB at rest, speaks in single words, struggling to breathe, sitting hunched forward, retractions, pulse > 120      Mild  SOB with coughing  6. FEVER: "Do you have a fever?" If  Yes, ask: "What is your temperature, how was it measured, and when did it start?"     1 week ago 100. 7. CARDIAC HISTORY: "Do you have any history of heart disease?" (e.g., heart attack, congestive heart failure)      Na  8. LUNG HISTORY: "Do you have any history of lung disease?"  (e.g., pulmonary embolus, asthma, emphysema)     No . Hx bronchitis and pneumonia  9. PE RISK FACTORS: "Do you have a history of blood clots?" (or: recent major surgery, recent prolonged travel, bedridden)     na 10. OTHER SYMPTOMS: "Do you have any other symptoms?" (e.g., runny nose, wheezing, chest pain)       Cough, SOB after coughing , can hear rattling in chest at times.  11. PREGNANCY: "Is there any chance you are pregnant?" "When was your last menstrual period?"       na 12. TRAVEL: "Have you traveled out of the country in the last month?" (e.g., travel history, exposures)       Na   Completed prescribed meds after 08/26/23 OV  and felt better but now cough returned. Concerned due to hx pneumonia. Feels worse.  Protocols used: Cough - Acute Non-Productive-A-AH

## 2023-10-06 NOTE — Progress Notes (Signed)
 Acute Office Visit  Subjective:     Patient ID: Cassidy Dawson, female    DOB: 1956-02-28, 68 y.o.   MRN: [id number]  Chief Complaint  Patient presents with   Cough    Started 1-2 weeks ago. Patient has cough, and SOB with exertion. Fever of 101 about 7 days ago but not since then. Dry cough. Pt was seen at the end of April by Dr Gala Jubilee and was prescribed prednisone , and doxycycline . Patient said symptoms were better after abx, but returned recently. Patient c/o headache last night.    HPI Patient is in today for lingering cough and SOB on exertion, low grade fever week ago. Cough is mostly dry and feels congested in sinuses and chest. She took mucinex with no significant relief. Was seen in the clinic > month ago and was treated as mentioned in cc.   Pt is concerned that she may have pneumonia.   Hx of CHF, compliant with medications. No weight gain since last visit. Planning to see  Cardiology for routine follow up.    Review of Systems  All other systems reviewed and are negative.       Objective:    BP 124/80   Pulse 76   Temp 99 F (37.2 C) (Oral)   Ht 5\' 2"  (1.575 m)   Wt 175 lb (79.4 kg)   SpO2 96%   BMI 32.01 kg/m    Physical Exam Vitals and nursing note reviewed.  Constitutional:      Appearance: Normal appearance.  HENT:     Head: Normocephalic.     Right Ear: External ear normal.     Left Ear: External ear normal.  Eyes:     Conjunctiva/sclera: Conjunctivae normal.  Cardiovascular:     Rate and Rhythm: Normal rate.  Pulmonary:     Effort: Pulmonary effort is normal. No respiratory distress.  Abdominal:     Palpations: Abdomen is soft.  Musculoskeletal:        General: Normal range of motion.  Skin:    General: Skin is warm.  Neurological:     Mental Status: She is alert and oriented to person, place, and time.  Psychiatric:        Mood and Affect: Mood normal.     Results for orders placed or performed in visit on 10/06/23   POC COVID-19 BinaxNow  Result Value Ref Range   SARS Coronavirus 2 Ag Negative Negative        Assessment & Plan:   Problem List Items Addressed This Visit       Cardiovascular and Mediastinum   Acute on chronic combined systolic and diastolic congestive heart failure (HCC)     Genitourinary   Chronic kidney disease, stage 3a (HCC) (Chronic)   Other Visit Diagnoses       Cough with fever    -  Primary   Relevant Medications   doxycycline  (VIBRA -TABS) 100 MG tablet   Other Relevant Orders   POC COVID-19 BinaxNow (Completed)   DG Chest 2 View   C-reactive protein   Comprehensive metabolic panel with GFR   CBC with Differential     SOB (shortness of breath) on exertion       Relevant Orders   DG Chest 2 View   Brain natriuretic peptide     Likely URI, will order labs to rule out pneumonia and check for BMP to rule out CHF exacerbation.  Meds ordered this encounter  Medications   doxycycline  (VIBRA -TABS)  100 MG tablet    Sig: Take 1 tablet (100 mg total) by mouth 2 (two) times daily for 10 days.    Dispense:  20 tablet    Refill:  0   benzonatate  (TESSALON ) 200 MG capsule    Sig: Take 1 capsule (200 mg total) by mouth 3 (three) times daily as needed for cough.    Dispense:  20 capsule    Refill:  0    No follow-ups on file.  Vinary K Halla Chopp, MD

## 2023-10-07 ENCOUNTER — Ambulatory Visit: Payer: Self-pay | Admitting: Family Medicine

## 2023-10-08 LAB — COMPREHENSIVE METABOLIC PANEL WITH GFR
ALT: 24 IU/L (ref 0–32)
AST: 21 IU/L (ref 0–40)
Albumin: 4.1 g/dL (ref 3.9–4.9)
Alkaline Phosphatase: 102 IU/L (ref 44–121)
BUN/Creatinine Ratio: 15 (ref 12–28)
BUN: 20 mg/dL (ref 8–27)
Bilirubin Total: 1.2 mg/dL (ref 0.0–1.2)
CO2: 23 mmol/L (ref 20–29)
Calcium: 9.4 mg/dL (ref 8.7–10.3)
Chloride: 106 mmol/L (ref 96–106)
Creatinine, Ser: 1.33 mg/dL — ABNORMAL HIGH (ref 0.57–1.00)
Globulin, Total: 2.4 g/dL (ref 1.5–4.5)
Glucose: 82 mg/dL (ref 70–99)
Potassium: 4.9 mmol/L (ref 3.5–5.2)
Sodium: 143 mmol/L (ref 134–144)
Total Protein: 6.5 g/dL (ref 6.0–8.5)
eGFR: 43 mL/min/{1.73_m2} — ABNORMAL LOW (ref >59)

## 2023-10-08 LAB — CBC WITH DIFFERENTIAL/PLATELET
Basophils Absolute: 0 10*3/uL (ref 0.0–0.2)
Basos: 1 %
EOS (ABSOLUTE): 0.1 10*3/uL (ref 0.0–0.4)
Eos: 1 %
Hematocrit: 40 % (ref 34.0–46.6)
Hemoglobin: 12.3 g/dL (ref 11.1–15.9)
Immature Grans (Abs): 0 10*3/uL (ref 0.0–0.1)
Immature Granulocytes: 0 %
Lymphocytes Absolute: 2 10*3/uL (ref 0.7–3.1)
Lymphs: 26 %
MCH: 27.9 pg (ref 26.6–33.0)
MCHC: 30.8 g/dL — ABNORMAL LOW (ref 31.5–35.7)
MCV: 91 fL (ref 79–97)
Monocytes Absolute: 0.6 10*3/uL (ref 0.1–0.9)
Monocytes: 8 %
Neutrophils Absolute: 4.8 10*3/uL (ref 1.4–7.0)
Neutrophils: 64 %
Platelets: 357 10*3/uL (ref 150–450)
RBC: 4.41 x10E6/uL (ref 3.77–5.28)
RDW: 13.7 % (ref 11.7–15.4)
WBC: 7.5 10*3/uL (ref 3.4–10.8)

## 2023-10-08 LAB — C-REACTIVE PROTEIN: CRP: 8 mg/L (ref 0–10)

## 2023-10-10 ENCOUNTER — Telehealth: Payer: Self-pay

## 2023-10-10 NOTE — Telephone Encounter (Signed)
 Copied from CRM [id number]. Topic: Clinical - Lab/Test Results >> Oct 10, 2023 10:42 AM Zipporah Him wrote: Reason for CRM: Patient called in to get lab results, was able to provide, patient verbalized understanding. Requesting a call back in regard to imaging results.

## 2023-10-10 NOTE — Telephone Encounter (Signed)
 Discussed the results and addressed her concerns. Pt has lingering cough and she is responding to the tessalon  pearls. She will reach out to Cardiology for a follow up.  Dr.K

## 2023-11-21 ENCOUNTER — Telehealth: Payer: Self-pay | Admitting: Internal Medicine

## 2023-11-21 NOTE — Telephone Encounter (Unsigned)
 Copied from CRM [id number]. Topic: Clinical - Medication Refill >> Nov 21, 2023 10:11 AM Drema MATSU wrote: Medication: cetirizine  (ZYRTEC ) 10 MG tablet carvedilol  (COREG ) 25 MG tablet, fluticasone  (FLONASE ) 50 MCG/ACT nasal spray  Has the patient contacted their pharmacy? Yes (Agent: If no, request that the patient contact the pharmacy for the refill. If patient does not wish to contact the pharmacy document the reason why and proceed with request.) no more refills (Agent: If yes, when and what did the pharmacy advise?)  This is the patient's preferred pharmacy:  [id number] [place] - [address] AT [facility] [address] [place] [address] Phone: [phone] Fax: [phone]  Is this the correct pharmacy for this prescription? Yes If no, delete pharmacy and type the correct one.   Has the prescription been filled recently? Yes for carvedilol  and no to the rest   Is the patient out of the medication? yes have 7 days left of carvedilol / out of Zyrtec   Has the patient been seen for an appointment in the last year OR does the patient have an upcoming appointment? Yes  Can we respond through MyChart? Yes  Agent: Please be advised that Rx refills may take up to 3 business days. We ask that you follow-up with your pharmacy.

## 2023-11-29 ENCOUNTER — Ambulatory Visit (INDEPENDENT_AMBULATORY_CARE_PROVIDER_SITE_OTHER): Admitting: Family Medicine

## 2023-11-29 ENCOUNTER — Other Ambulatory Visit: Payer: Self-pay | Admitting: Family Medicine

## 2023-11-29 ENCOUNTER — Encounter: Payer: Self-pay | Admitting: Family Medicine

## 2023-11-29 ENCOUNTER — Other Ambulatory Visit: Payer: Self-pay | Admitting: Internal Medicine

## 2023-11-29 VITALS — BP 144/100 | HR 73 | Ht 62.0 in | Wt 178.0 lb

## 2023-11-29 DIAGNOSIS — I5022 Chronic systolic (congestive) heart failure: Secondary | ICD-10-CM | POA: Diagnosis not present

## 2023-11-29 DIAGNOSIS — R0602 Shortness of breath: Secondary | ICD-10-CM

## 2023-11-29 MED ORDER — FUROSEMIDE 40 MG PO TABS: 40.0000 mg | ORAL_TABLET | Freq: Every day | ORAL | 0 refills | Status: AC

## 2023-11-29 MED ORDER — SACUBITRIL-VALSARTAN 49-51 MG PO TABS: 1.0000 | ORAL_TABLET | Freq: Two times a day (BID) | ORAL | 0 refills | Status: AC

## 2023-11-29 MED ORDER — CARVEDILOL 25 MG PO TABS: 25.0000 mg | ORAL_TABLET | Freq: Two times a day (BID) | ORAL | 0 refills | Status: DC

## 2023-11-29 MED ORDER — JARDIANCE 10 MG PO TABS: 10.0000 mg | ORAL_TABLET | Freq: Every day | ORAL | 0 refills | Status: DC

## 2023-11-29 NOTE — Progress Notes (Signed)
 Primary Care / Sports Medicine Office Visit  Patient Information:  Patient ID: Cassidy Dawson, female DOB: 03-24-1956 Age: 68 y.o. MRN: [id number]   Cassidy Dawson is a pleasant 68 y.o. female presenting with the following:  Chief Complaint  Patient presents with   Nausea    Nausea since this morning.    Breathing Problem    Patient having problem catching her breath after light walking and even sometimes when she is just talking. She has been going on for a while.     Vitals:   11/29/23 1550  BP: (!) 144/100  Pulse: 73  SpO2: 98%   Vitals:   11/29/23 1550  Weight: 178 lb (80.7 kg)  Height: 5' 2 (1.575 m)   Body mass index is 32.56 kg/m.  No results found.   Independent interpretation of notes and tests performed by another provider:   None  Procedures performed:   None  Pertinent History, Exam, Impression, and Recommendations:   Problem List Items Addressed This Visit     Chronic systolic heart failure (HCC)   History of Present Illness Cassidy Dawson is a 68 year old female with congestive heart failure who presents with breathing difficulties.  Dyspnea - Difficulty breathing characterized as a struggle to take a deep breath, denies sensation of smothering - No orthopnea; does not lay flat due to personal preference, not respiratory distress - Occasional transient wheezing over the past couple of weeks - No significant worsening of dyspnea with walking around the house or climbing stairs - Shortness of breath with exertion, specifically while walking in [employer] yesterday and more pronounced at work today, accompanied by nausea - No chest pain or discomfort  Peripheral edema - Takes Lasix  for fluid retention (last dose 3-4 days prior) - Improvement in swelling after taking Lasix  and lying down - Running low on Lasix  and plans to obtain more  Antihypertensive and heart failure medication adherence - History of hypertension  and congestive heart failure - Entresto  prescription needs refill - Has not taken Entresto  since April due to cost - Off Jardiance  since June, also due to financial barriers  Physical Exam CHEST: Lungs clear to auscultation bilaterally, no wheezes, bibasilar rales, or rhonchi. CARDIOVASCULAR: Regular rate and rhythm, positive S1 and S2, no additional heart sounds. No JVD bilaterally. 2+ bounding radial pulses bilaterally. ABDOMEN: Normal bowel sounds. Abdomen soft, non-tender, not distended. No pulsatile mass. EXTREMITIES: Trace to 1+ pitting edema bilateral lower ankles.  Results EKG: No new findings; consistent with 2023 EKG (11/29/2023), sinus rhythm, lateral wall t-wave inversions, no ST changes  Assessment and Plan Congestive heart failure with associated peripheral edema, chronic cough, and dyspnea Chronic cough, dyspnea, and peripheral edema suggest cardiac etiology, likely heart failure exacerbation due to medication nonadherence. No new EKG findings. Differential includes cardiac cough from fluid backup. - Refill and resume Entresto , Lasix , and carvedilol . - Schedule cardiology appointment with Dr. Foye, attempt to move up from August 21st. - Perform EKG to assess for new cardiac changes. - Seek emergency care if experiencing chest pain, severe nausea, or other concerning symptoms. - Encouraged fluid intake to prevent kidney dehydration while on Lasix .  Hypertension Elevated blood pressure likely due to medication nonadherence. Entresto  and carvedilol  essential for management. - Refill and resume Entresto  and carvedilol . - Attempted to schedule sooner follow-up with cardiology, no sooner dates available. - Return next week for close follow-up      Relevant Medications   sacubitril -valsartan  (ENTRESTO ) 49-51 MG  furosemide  (LASIX ) 40 MG tablet   carvedilol  (COREG ) 25 MG tablet   Other Visit Diagnoses       SOB (shortness of breath) on exertion    -  Primary    Relevant Orders   EKG 12-Lead (Completed)      A total of 45 minutes was spent on the date of service, 11/29/2023, encompassing both face-to-face and non-face-to-face time. This included review of prior records and imaging (e.g., MRI and/or radiographs), medical chart review, information gathering, documentation, care coordination with clinic staff, discussion and counseling with the patient regarding clinical findings and treatment options, and planning for follow-up and next steps in management.   Orders & Medications Medications:  Meds ordered this encounter  Medications   JARDIANCE  10 MG TABS tablet    Sig: Take 1 tablet (10 mg total) by mouth daily.    Dispense:  30 tablet    Refill:  0   sacubitril -valsartan  (ENTRESTO ) 49-51 MG    Sig: Take 1 tablet by mouth 2 (two) times daily.    Dispense:  60 tablet    Refill:  0   furosemide  (LASIX ) 40 MG tablet    Sig: Take 1 tablet (40 mg total) by mouth daily.    Dispense:  30 tablet    Refill:  0   carvedilol  (COREG ) 25 MG tablet    Sig: Take 1 tablet (25 mg total) by mouth 2 (two) times daily with a meal. TAKE 1 TABLET(25 MG) BY MOUTH TWICE DAILY    Dispense:  60 tablet    Refill:  0   Orders Placed This Encounter  Procedures   EKG 12-Lead     No follow-ups on file.     Selinda JINNY Ku, MD, [facility]   Primary Care Sports Medicine Primary Care and Sports Medicine at [facility] [place]

## 2023-11-29 NOTE — Assessment & Plan Note (Addendum)
 History of Present Illness Cassidy Dawson is a 68 year old female with congestive heart failure who presents with breathing difficulties.  Dyspnea - Difficulty breathing characterized as a struggle to take a deep breath, denies sensation of smothering - No orthopnea; does not lay flat due to personal preference, not respiratory distress - Occasional transient wheezing over the past couple of weeks - No significant worsening of dyspnea with walking around the house or climbing stairs - Shortness of breath with exertion, specifically while walking in [employer] yesterday and more pronounced at work today, accompanied by nausea - No chest pain or discomfort  Peripheral edema - Takes Lasix  for fluid retention (last dose 3-4 days prior) - Improvement in swelling after taking Lasix  and lying down - Running low on Lasix  and plans to obtain more  Antihypertensive and heart failure medication adherence - History of hypertension and congestive heart failure - Entresto  prescription needs refill - Has not taken Entresto  since April due to cost - Off Jardiance  since June, also due to financial barriers  Physical Exam CHEST: Lungs clear to auscultation bilaterally, no wheezes, bibasilar rales, or rhonchi. CARDIOVASCULAR: Regular rate and rhythm, positive S1 and S2, no additional heart sounds. No JVD bilaterally. 2+ bounding radial pulses bilaterally. ABDOMEN: Normal bowel sounds. Abdomen soft, non-tender, not distended. No pulsatile mass. EXTREMITIES: Trace to 1+ pitting edema bilateral lower ankles.  Results EKG: No new findings; consistent with 2023 EKG (11/29/2023), sinus rhythm, lateral wall t-wave inversions, no ST changes  Assessment and Plan Congestive heart failure with associated peripheral edema, chronic cough, and dyspnea Chronic cough, dyspnea, and peripheral edema suggest cardiac etiology, likely heart failure exacerbation due to medication nonadherence. No new EKG findings.  Differential includes cardiac cough from fluid backup. - Refill and resume Entresto , Lasix , and carvedilol . - Schedule cardiology appointment with Dr. Foye, attempt to move up from August 21st. - Perform EKG to assess for new cardiac changes. - Seek emergency care if experiencing chest pain, severe nausea, or other concerning symptoms. - Encouraged fluid intake to prevent kidney dehydration while on Lasix .  Hypertension Elevated blood pressure likely due to medication nonadherence. Entresto  and carvedilol  essential for management. - Refill and resume Entresto  and carvedilol . - Attempted to schedule sooner follow-up with cardiology, no sooner dates available. - Return next week for close follow-up

## 2023-11-29 NOTE — Patient Instructions (Signed)
 Patient Plan for Post-Visit Guidance  1. Resume taking Entresto , Lasix , and carvedilol  as prescribed. 2. Obtain refills for Entresto , Lasix , and carvedilol  as soon as possible. 3. Schedule and attend your cardiology appointment with Dr. Foye 4. Monitor for any new or worsening symptoms. 5. Return to the office next week for close follow-up.  Red Flags: - Seek emergency care if you experience chest pain, severe nausea, severe shortness of breath, fainting, or other concerning symptoms.

## 2023-11-30 ENCOUNTER — Telehealth: Payer: Self-pay

## 2023-11-30 NOTE — Telephone Encounter (Signed)
 Med has been sent in today.  - CMcAdoo

## 2023-11-30 NOTE — Telephone Encounter (Signed)
 Long term Rx- will give 90 day Rx Requested Prescriptions  Pending Prescriptions Disp Refills   carvedilol  (COREG ) 25 MG tablet [Pharmacy Med Name: CARVEDILOL  25MG  TABLETS] 180 tablet     Sig: TAKE 1 TABLET(25 MG) BY MOUTH TWICE DAILY WITH A MEAL     Cardiovascular: Beta Blockers 3 Failed - 11/30/2023  2:46 PM      Failed - Cr in normal range and within 360 days    Creatinine, Ser  Date Value Ref Range Status  10/06/2023 1.33 (H) 0.57 - 1.00 mg/dL Final         Failed - Last BP in normal range    BP Readings from Last 1 Encounters:  11/29/23 (!) 144/100         Passed - AST in normal range and within 360 days    AST  Date Value Ref Range Status  10/06/2023 21 0 - 40 IU/L Final         Passed - ALT in normal range and within 360 days    ALT  Date Value Ref Range Status  10/06/2023 24 0 - 32 IU/L Final         Passed - Last Heart Rate in normal range    Pulse Readings from Last 1 Encounters:  11/29/23 73         Passed - Valid encounter within last 6 months    Recent Outpatient Visits           Yesterday SOB (shortness of breath) on exertion   [place] [facility] at [facility], Selinda PARAS, MD   1 month ago Cough with fever   [facility] & [facility] Medicine at [facility], Vinay K, MD   3 months ago Bronchitis   [facility] Primary Care & Sports Medicine at [facility], Leita DEL, MD       Future Appointments             In 1 week Ku, Selinda PARAS, MD [facility] Primary Care & Sports Medicine at [facility], [facility]

## 2023-11-30 NOTE — Telephone Encounter (Signed)
 Copied from CRM [id number]. Topic: Clinical - Prescription Issue >> Nov 30, 2023  2:36 PM Sophia H wrote: Reason for CRM: Patient was told to resume carvedilol  (COREG ) 25 MG tablet yesterday when she saw Dr. Alvia, states pharmacy advised they are waiting on approval from the provider? Please advise, # [id number]   [id number] - [place], [place] - [address] [place] [address] [place] [address]

## 2023-12-02 ENCOUNTER — Other Ambulatory Visit: Payer: Self-pay | Admitting: Family Medicine

## 2023-12-02 NOTE — Telephone Encounter (Signed)
 Duplicate request, LRF 11/30/23.  Requested Prescriptions  Pending Prescriptions Disp Refills   carvedilol  (COREG ) 25 MG tablet [Pharmacy Med Name: CARVEDILOL  25MG  TABLETS] 180 tablet 0    Sig: TAKE 1 TABLET(25 MG) BY MOUTH TWICE DAILY WITH A MEAL     Cardiovascular: Beta Blockers 3 Failed - 12/02/2023  3:34 PM      Failed - Cr in normal range and within 360 days    Creatinine, Ser  Date Value Ref Range Status  10/06/2023 1.33 (H) 0.57 - 1.00 mg/dL Final         Failed - Last BP in normal range    BP Readings from Last 1 Encounters:  11/29/23 (!) 144/100         Passed - AST in normal range and within 360 days    AST  Date Value Ref Range Status  10/06/2023 21 0 - 40 IU/L Final         Passed - ALT in normal range and within 360 days    ALT  Date Value Ref Range Status  10/06/2023 24 0 - 32 IU/L Final         Passed - Last Heart Rate in normal range    Pulse Readings from Last 1 Encounters:  11/29/23 73         Passed - Valid encounter within last 6 months    Recent Outpatient Visits           3 days ago SOB (shortness of breath) on exertion   [place] [facility] at [facility], Selinda PARAS, MD   1 month ago Cough with fever   [facility] Primary Care & Sports Medicine at [facility], Vinay K, MD   3 months ago Bronchitis   [facility] Primary Care & Sports Medicine at [facility], Leita DEL, MD       Future Appointments             In 6 days Ku, Selinda PARAS, MD [facility] Health Primary Care & Sports Medicine at [facility], [facility]

## 2023-12-08 ENCOUNTER — Encounter: Payer: Self-pay | Admitting: Family Medicine

## 2023-12-08 ENCOUNTER — Ambulatory Visit (INDEPENDENT_AMBULATORY_CARE_PROVIDER_SITE_OTHER): Admitting: Family Medicine

## 2023-12-08 VITALS — BP 118/68 | HR 62 | Ht 62.0 in | Wt 169.6 lb

## 2023-12-08 DIAGNOSIS — I5022 Chronic systolic (congestive) heart failure: Secondary | ICD-10-CM | POA: Diagnosis not present

## 2023-12-08 NOTE — Patient Instructions (Signed)
 Patient Plan for Post-Visit Guidance  Heart Failure with Fluid Retention  - Continue taking all current medications, including Jardiance  and Entresto . - Discuss medication costs and possible alternatives with your cardiologist at your next visit. - Keep working with Nurse, mental health for help with medication assistance. - Contact the clinic if you have any problems getting your medications or with insurance coverage.  Red Flags: - If you notice sudden weight gain, swelling in your legs or abdomen, shortness of breath, chest pain, or any new or worsening symptoms, contact the office immediately.

## 2023-12-08 NOTE — Assessment & Plan Note (Signed)
 History of Present Illness Cassidy Dawson is a 68 year old female with heart failure who presents for medication management and follow-up.  Heart failure symptomatology and weight status - Significant improvement in overall symptoms - Weight loss of nine pounds since last visit  Medication adherence and access - Resumed all prescribed medications, including Jardiance  and Entresto , after resolving insurance coverage issues - Currently enrolled in a program where the insurance company covers medication costs upfront, with repayment by the patient - Pays $47 foJardiance  after meeting deductible and copay  Cardiology follow-up - Follow-up appointment with cardiologist scheduled in two weeks  Physical Exam CHEST: Lungs clear to auscultation bilaterally, no wheezes, no rales, no rhonchi. CARDIOVASCULAR: Regular rate and rhythm, normal S1 and S2, no additional heart sounds. EXTREMITIES: No peripheral edema at the ankles.  Assessment and Plan Heart failure with fluid retention Significant improvement with nine-pound weight loss, likely due to reduced fluid retention. - Continue current medications, including Jardiance  and Entresto . - Discuss medication cost and alternatives with cardiologist during upcoming visit. - Continue working with Child psychotherapist for medication assistance. - Contact clinic if issues with medication availability or insurance.

## 2023-12-08 NOTE — Progress Notes (Signed)
     Primary Care / Sports Medicine Office Visit  Patient Information:  Patient ID: Cassidy Dawson, female DOB: 1955/06/20 Age: 68 y.o. MRN: [id number]   Cassidy Dawson is a pleasant 68 y.o. female presenting with the following:  Chief Complaint  Patient presents with   Follow-up    Patient following up on her SOB from last week. She is doing much better today almost at 100%.     Vitals:   12/08/23 1447  BP: 118/68  Pulse: 62  SpO2: 98%   Vitals:   12/08/23 1447  Weight: 169 lb 9.6 oz (76.9 kg)  Height: 5' 2 (1.575 m)   Body mass index is 31.02 kg/m.  No results found.   Independent interpretation of notes and tests performed by another provider:   None  Procedures performed:   None  Pertinent History, Exam, Impression, and Recommendations:   Problem List Items Addressed This Visit     Chronic systolic heart failure (HCC) - Primary   History of Present Illness Cassidy Dawson is a 68 year old female with heart failure who presents for medication management and follow-up.  Heart failure symptomatology and weight status - Significant improvement in overall symptoms - Weight loss of nine pounds since last visit  Medication adherence and access - Resumed all prescribed medications, including Jardiance  and Entresto , after resolving insurance coverage issues - Currently enrolled in a program where the insurance company covers medication costs upfront, with repayment by the patient - Pays $47 foJardiance  after meeting deductible and copay  Cardiology follow-up - Follow-up appointment with cardiologist scheduled in two weeks  Physical Exam CHEST: Lungs clear to auscultation bilaterally, no wheezes, no rales, no rhonchi. CARDIOVASCULAR: Regular rate and rhythm, normal S1 and S2, no additional heart sounds. EXTREMITIES: No peripheral edema at the ankles.  Assessment and Plan Heart failure with fluid retention Significant improvement  with nine-pound weight loss, likely due to reduced fluid retention. - Continue current medications, including Jardiance  and Entresto . - Discuss medication cost and alternatives with cardiologist during upcoming visit. - Continue working with Child psychotherapist for medication assistance. - Contact clinic if issues with medication availability or insurance.        Orders & Medications Medications: No orders of the defined types were placed in this encounter.  No orders of the defined types were placed in this encounter.    No follow-ups on file.     Selinda JINNY Ku, MD, [facility]   Primary Care Sports Medicine Primary Care and Sports Medicine at [facility]

## 2023-12-22 DIAGNOSIS — R799 Abnormal finding of blood chemistry, unspecified: Secondary | ICD-10-CM | POA: Diagnosis not present

## 2023-12-22 DIAGNOSIS — I214 Non-ST elevation (NSTEMI) myocardial infarction: Secondary | ICD-10-CM | POA: Diagnosis not present

## 2023-12-22 DIAGNOSIS — E782 Mixed hyperlipidemia: Secondary | ICD-10-CM | POA: Diagnosis not present

## 2023-12-22 DIAGNOSIS — I10 Essential (primary) hypertension: Secondary | ICD-10-CM | POA: Diagnosis not present

## 2023-12-22 DIAGNOSIS — I5022 Chronic systolic (congestive) heart failure: Secondary | ICD-10-CM | POA: Diagnosis not present

## 2023-12-25 DIAGNOSIS — E782 Mixed hyperlipidemia: Secondary | ICD-10-CM | POA: Diagnosis not present

## 2023-12-25 DIAGNOSIS — I214 Non-ST elevation (NSTEMI) myocardial infarction: Secondary | ICD-10-CM | POA: Diagnosis not present

## 2023-12-25 DIAGNOSIS — I10 Essential (primary) hypertension: Secondary | ICD-10-CM | POA: Diagnosis not present

## 2023-12-25 DIAGNOSIS — I5022 Chronic systolic (congestive) heart failure: Secondary | ICD-10-CM | POA: Diagnosis not present

## 2023-12-31 DIAGNOSIS — I509 Heart failure, unspecified: Secondary | ICD-10-CM | POA: Diagnosis not present

## 2023-12-31 DIAGNOSIS — I110 Hypertensive heart disease with heart failure: Secondary | ICD-10-CM | POA: Diagnosis not present

## 2023-12-31 DIAGNOSIS — R7989 Other specified abnormal findings of blood chemistry: Secondary | ICD-10-CM | POA: Diagnosis not present

## 2023-12-31 DIAGNOSIS — R918 Other nonspecific abnormal finding of lung field: Secondary | ICD-10-CM | POA: Diagnosis not present

## 2023-12-31 DIAGNOSIS — J810 Acute pulmonary edema: Secondary | ICD-10-CM | POA: Diagnosis not present

## 2023-12-31 DIAGNOSIS — Z885 Allergy status to narcotic agent status: Secondary | ICD-10-CM | POA: Diagnosis not present

## 2023-12-31 DIAGNOSIS — E785 Hyperlipidemia, unspecified: Secondary | ICD-10-CM | POA: Diagnosis not present

## 2023-12-31 DIAGNOSIS — R0989 Other specified symptoms and signs involving the circulatory and respiratory systems: Secondary | ICD-10-CM | POA: Diagnosis not present

## 2023-12-31 DIAGNOSIS — I252 Old myocardial infarction: Secondary | ICD-10-CM | POA: Diagnosis not present

## 2023-12-31 DIAGNOSIS — Z79899 Other long term (current) drug therapy: Secondary | ICD-10-CM | POA: Diagnosis not present

## 2023-12-31 DIAGNOSIS — I214 Non-ST elevation (NSTEMI) myocardial infarction: Secondary | ICD-10-CM | POA: Diagnosis not present

## 2023-12-31 DIAGNOSIS — Z7982 Long term (current) use of aspirin: Secondary | ICD-10-CM | POA: Diagnosis not present

## 2023-12-31 DIAGNOSIS — I2510 Atherosclerotic heart disease of native coronary artery without angina pectoris: Secondary | ICD-10-CM | POA: Diagnosis not present

## 2024-01-04 ENCOUNTER — Other Ambulatory Visit: Payer: Self-pay | Admitting: Family Medicine

## 2024-01-05 NOTE — Telephone Encounter (Signed)
 Requested Prescriptions  Pending Prescriptions Disp Refills   JARDIANCE  10 MG TABS tablet [Pharmacy Med Name: JARDIANCE  10MG  TABLETS] 90 tablet 0    Sig: TAKE 1 TABLET(10 MG) BY MOUTH DAILY     Endocrinology:  Diabetes - SGLT2 Inhibitors Failed - 01/05/2024  8:37 AM      Failed - Cr in normal range and within 360 days    Creatinine, Ser  Date Value Ref Range Status  10/06/2023 1.33 (H) 0.57 - 1.00 mg/dL Final         Failed - HBA1C is between 0 and 7.9 and within 180 days    Hemoglobin A1C  Date Value Ref Range Status  08/29/2019 6.3  Final   Hgb A1c MFr Bld  Date Value Ref Range Status  02/24/2022 5.7 (H) 4.8 - 5.6 % Final    Comment:             Prediabetes: 5.7 - 6.4          Diabetes: >6.4          Glycemic control for adults with diabetes: <7.0          Failed - eGFR in normal range and within 360 days    GFR calc Af Amer  Date Value Ref Range Status  10/04/2019 89 >59 mL/min/1.73 Final    Comment:    **[facility] currently reports eGFR in compliance with the current**   recommendations of the [employer]. [facility] will   update reporting as new guidelines are published from the NKF-ASN   Task force.    GFR calc non Af Amer  Date Value Ref Range Status  10/04/2019 78 >59 mL/min/1.73 Final   eGFR  Date Value Ref Range Status  10/06/2023 43 (L) >59 mL/min/1.73 Final         Passed - Valid encounter within last 6 months    Recent Outpatient Visits           4 weeks ago Chronic systolic heart failure (HCC)   [place] [facility] at [facility], Selinda PARAS, MD   1 month ago SOB (shortness of breath) on exertion   [facility] Primary Care & Sports Medicine at [facility], Selinda PARAS, MD   3 months ago Cough with fever   [facility] Primary Care & Sports Medicine at [facility], Vinay K, MD   4 months ago Bronchitis   [facility] Primary Care & Sports Medicine at [facility], Leita DEL, MD

## 2024-01-05 NOTE — Telephone Encounter (Signed)
 Requested Prescriptions  Refused Prescriptions Disp Refills   JARDIANCE  10 MG TABS tablet [Pharmacy Med Name: JARDIANCE  10MG  TABLETS] 30 tablet 0    Sig: TAKE 1 TABLET(10 MG) BY MOUTH DAILY     Endocrinology:  Diabetes - SGLT2 Inhibitors Failed - 01/05/2024  1:09 PM      Failed - Cr in normal range and within 360 days    Creatinine, Ser  Date Value Ref Range Status  10/06/2023 1.33 (H) 0.57 - 1.00 mg/dL Final         Failed - HBA1C is between 0 and 7.9 and within 180 days    Hemoglobin A1C  Date Value Ref Range Status  08/29/2019 6.3  Final   Hgb A1c MFr Bld  Date Value Ref Range Status  02/24/2022 5.7 (H) 4.8 - 5.6 % Final    Comment:             Prediabetes: 5.7 - 6.4          Diabetes: >6.4          Glycemic control for adults with diabetes: <7.0          Failed - eGFR in normal range and within 360 days    GFR calc Af Amer  Date Value Ref Range Status  10/04/2019 89 >59 mL/min/1.73 Final    Comment:    **[facility] currently reports eGFR in compliance with the current**   recommendations of the [employer]. [facility] will   update reporting as new guidelines are published from the NKF-ASN   Task force.    GFR calc non Af Amer  Date Value Ref Range Status  10/04/2019 78 >59 mL/min/1.73 Final   eGFR  Date Value Ref Range Status  10/06/2023 43 (L) >59 mL/min/1.73 Final         Passed - Valid encounter within last 6 months    Recent Outpatient Visits           4 weeks ago Chronic systolic heart failure (HCC)   [place] [facility] at [facility], Selinda PARAS, MD   1 month ago SOB (shortness of breath) on exertion   [facility] Primary Care & Sports Medicine at [facility], Selinda PARAS, MD   3 months ago Cough with fever   [facility] Primary Care & Sports Medicine at [facility], Vinay K, MD   4 months ago Bronchitis   [facility] Primary Care & Sports Medicine at [facility], Leita DEL, MD

## 2024-01-16 DIAGNOSIS — I130 Hypertensive heart and chronic kidney disease with heart failure and stage 1 through stage 4 chronic kidney disease, or unspecified chronic kidney disease: Secondary | ICD-10-CM | POA: Diagnosis not present

## 2024-01-16 DIAGNOSIS — I255 Ischemic cardiomyopathy: Secondary | ICD-10-CM | POA: Diagnosis not present

## 2024-01-16 DIAGNOSIS — I5022 Chronic systolic (congestive) heart failure: Secondary | ICD-10-CM | POA: Diagnosis not present

## 2024-01-16 DIAGNOSIS — I2510 Atherosclerotic heart disease of native coronary artery without angina pectoris: Secondary | ICD-10-CM | POA: Diagnosis not present

## 2024-01-16 DIAGNOSIS — I252 Old myocardial infarction: Secondary | ICD-10-CM | POA: Diagnosis not present

## 2024-01-16 DIAGNOSIS — Z955 Presence of coronary angioplasty implant and graft: Secondary | ICD-10-CM | POA: Diagnosis not present

## 2024-01-16 DIAGNOSIS — N189 Chronic kidney disease, unspecified: Secondary | ICD-10-CM | POA: Diagnosis not present

## 2024-02-20 NOTE — Progress Notes (Signed)
 Cassidy Dawson                                          MRN: [id number]   02/20/2024   The [facility] Quality Team Specialist reviewed this patient medical record for the purposes of chart review for care gap closure. The following were reviewed: chart review for care gap closure-colorectal cancer screening.    [facility]I Quality Team

## 2024-02-27 DIAGNOSIS — I110 Hypertensive heart disease with heart failure: Secondary | ICD-10-CM | POA: Diagnosis not present

## 2024-02-27 DIAGNOSIS — Z885 Allergy status to narcotic agent status: Secondary | ICD-10-CM | POA: Diagnosis not present

## 2024-02-27 DIAGNOSIS — Z7982 Long term (current) use of aspirin: Secondary | ICD-10-CM | POA: Diagnosis not present

## 2024-02-27 DIAGNOSIS — E785 Hyperlipidemia, unspecified: Secondary | ICD-10-CM | POA: Diagnosis not present

## 2024-02-27 DIAGNOSIS — I2510 Atherosclerotic heart disease of native coronary artery without angina pectoris: Secondary | ICD-10-CM | POA: Diagnosis not present

## 2024-02-27 DIAGNOSIS — Z79899 Other long term (current) drug therapy: Secondary | ICD-10-CM | POA: Diagnosis not present

## 2024-02-27 DIAGNOSIS — I5022 Chronic systolic (congestive) heart failure: Secondary | ICD-10-CM | POA: Diagnosis not present

## 2024-03-25 ENCOUNTER — Other Ambulatory Visit: Payer: Self-pay | Admitting: Internal Medicine

## 2024-03-27 NOTE — Telephone Encounter (Signed)
 Requested Prescriptions  Pending Prescriptions Disp Refills   carvedilol  (COREG ) 25 MG tablet [Pharmacy Med Name: CARVEDILOL  25MG  TABLETS] 180 tablet 0    Sig: TAKE 1 TABLET(25 MG) BY MOUTH TWICE DAILY     Cardiovascular: Beta Blockers 3 Failed - 03/27/2024  8:51 AM      Failed - Cr in normal range and within 360 days    Creatinine, Ser  Date Value Ref Range Status  10/06/2023 1.33 (H) 0.57 - 1.00 mg/dL Final         Passed - AST in normal range and within 360 days    AST  Date Value Ref Range Status  10/06/2023 21 0 - 40 IU/L Final         Passed - ALT in normal range and within 360 days    ALT  Date Value Ref Range Status  10/06/2023 24 0 - 32 IU/L Final         Passed - Last BP in normal range    BP Readings from Last 1 Encounters:  12/08/23 118/68         Passed - Last Heart Rate in normal range    Pulse Readings from Last 1 Encounters:  12/08/23 62         Passed - Valid encounter within last 6 months    Recent Outpatient Visits           3 months ago Chronic systolic heart failure (HCC)   [place] [facility] at [facility], Selinda PARAS, MD   3 months ago SOB (shortness of breath) on exertion   [facility] Primary Care & Sports Medicine at [facility], Selinda PARAS, MD   5 months ago Cough with fever   [facility] Primary Care & Sports Medicine at [facility], Vinay K, MD   7 months ago Bronchitis   [facility] Primary Care & Sports Medicine at [facility], Leita DEL, MD

## 2024-03-29 ENCOUNTER — Other Ambulatory Visit: Payer: Self-pay | Admitting: Internal Medicine

## 2024-04-03 NOTE — Telephone Encounter (Signed)
 Requested Prescriptions  Refused Prescriptions Disp Refills   carvedilol  (COREG ) 25 MG tablet [Pharmacy Med Name: CARVEDILOL  25MG  TABLETS] 180 tablet 0    Sig: TAKE 1 TABLET(25 MG) BY MOUTH TWICE DAILY     Cardiovascular: Beta Blockers 3 Failed - 04/03/2024 11:41 AM      Failed - Cr in normal range and within 360 days    Creatinine, Ser  Date Value Ref Range Status  10/06/2023 1.33 (H) 0.57 - 1.00 mg/dL Final         Passed - AST in normal range and within 360 days    AST  Date Value Ref Range Status  10/06/2023 21 0 - 40 IU/L Final         Passed - ALT in normal range and within 360 days    ALT  Date Value Ref Range Status  10/06/2023 24 0 - 32 IU/L Final         Passed - Last BP in normal range    BP Readings from Last 1 Encounters:  12/08/23 118/68         Passed - Last Heart Rate in normal range    Pulse Readings from Last 1 Encounters:  12/08/23 62         Passed - Valid encounter within last 6 months    Recent Outpatient Visits           3 months ago Chronic systolic heart failure (HCC)   [place] [facility] & [facility] Medicine at [facility], Selinda PARAS, MD   4 months ago SOB (shortness of breath) on exertion   [facility] Primary Care & Sports Medicine at [facility], Selinda PARAS, MD   6 months ago Cough with fever   [facility] Primary Care & Sports Medicine at [facility], Vinay K, MD   7 months ago Bronchitis   [facility] Health Primary Care & Sports Medicine at [facility], Leita DEL, MD

## 2024-05-01 ENCOUNTER — Other Ambulatory Visit: Payer: Self-pay | Admitting: Internal Medicine

## 2024-05-02 NOTE — Telephone Encounter (Signed)
 Requested medication (s) are due for refill today: yes  Requested medication (s) are on the active medication list: yes  Last refill:  01/05/24  Future visit scheduled: {Yes  Notes to clinic:  Unable to refill per protocol due to failed labs, no updated results.      Requested Prescriptions  Pending Prescriptions Disp Refills   JARDIANCE  10 MG TABS tablet [Pharmacy Med Name: JARDIANCE  10MG  TABLETS] 90 tablet 0    Sig: TAKE 1 TABLET(10 MG) BY MOUTH DAILY     Endocrinology:  Diabetes - SGLT2 Inhibitors Failed - 05/02/2024  3:53 PM      Failed - Cr in normal range and within 360 days    Creatinine, Ser  Date Value Ref Range Status  10/06/2023 1.33 (H) 0.57 - 1.00 mg/dL Final         Failed - HBA1C is between 0 and 7.9 and within 180 days    Hemoglobin A1C  Date Value Ref Range Status  08/29/2019 6.3  Final   Hgb A1c MFr Bld  Date Value Ref Range Status  02/24/2022 5.7 (H) 4.8 - 5.6 % Final    Comment:             Prediabetes: 5.7 - 6.4          Diabetes: >6.4          Glycemic control for adults with diabetes: <7.0          Failed - eGFR in normal range and within 360 days    GFR calc Af Amer  Date Value Ref Range Status  10/04/2019 89 >59 mL/min/1.73 Final    Comment:    **[facility] currently reports eGFR in compliance with the current**   recommendations of the [employer]. [facility] will   update reporting as new guidelines are published from the NKF-ASN   Task force.    GFR calc non Af Amer  Date Value Ref Range Status  10/04/2019 78 >59 mL/min/1.73 Final   eGFR  Date Value Ref Range Status  10/06/2023 43 (L) >59 mL/min/1.73 Final         Passed - Valid encounter within last 6 months    Recent Outpatient Visits           4 months ago Chronic systolic heart failure (HCC)   [place] [facility] Medicine at [facility], Selinda PARAS, MD   5 months ago SOB (shortness of breath) on exertion   [facility] Primary Care &  Sports Medicine at [facility], Selinda PARAS, MD   6 months ago Cough with fever   [facility] Health Primary Care & Sports Medicine at [facility], Vinay K, MD   8 months ago Bronchitis   [facility] Health Primary Care & Sports Medicine at [facility], Leita DEL, MD

## 2024-07-25 ENCOUNTER — Ambulatory Visit

## 2024-07-26 ENCOUNTER — Ambulatory Visit
